# Patient Record
Sex: Female | Born: 1990 | Race: Black or African American | Hispanic: No | Marital: Married | Smoking: Never smoker
Health system: Southern US, Community
[De-identification: ages and names within clinical notes are randomized; demographics above are authoritative.]

## PROBLEM LIST (undated history)

## (undated) DIAGNOSIS — M545 Low back pain, unspecified: Secondary | ICD-10-CM

## (undated) DIAGNOSIS — G8929 Other chronic pain: Secondary | ICD-10-CM

## (undated) DIAGNOSIS — G43909 Migraine, unspecified, not intractable, without status migrainosus: Secondary | ICD-10-CM

## (undated) HISTORY — DX: Low back pain: M54.5

## (undated) HISTORY — DX: Migraine, unspecified, not intractable, without status migrainosus: G43.909

## (undated) HISTORY — DX: Other chronic pain: G89.29

## (undated) HISTORY — DX: Low back pain, unspecified: M54.50

---

## 2016-07-20 ENCOUNTER — Encounter: Payer: Self-pay | Admitting: *Deleted

## 2016-07-21 ENCOUNTER — Encounter: Payer: Self-pay | Admitting: Neurology

## 2016-07-21 ENCOUNTER — Ambulatory Visit (INDEPENDENT_AMBULATORY_CARE_PROVIDER_SITE_OTHER): Payer: BLUE CROSS/BLUE SHIELD | Admitting: Neurology

## 2016-07-21 ENCOUNTER — Encounter (INDEPENDENT_AMBULATORY_CARE_PROVIDER_SITE_OTHER): Payer: Self-pay

## 2016-07-21 DIAGNOSIS — IMO0002 Reserved for concepts with insufficient information to code with codable children: Secondary | ICD-10-CM | POA: Insufficient documentation

## 2016-07-21 DIAGNOSIS — G43709 Chronic migraine without aura, not intractable, without status migrainosus: Secondary | ICD-10-CM

## 2016-07-21 MED ORDER — RIBOFLAVIN 100 MG PO CAPS: 100.0000 mg | ORAL_CAPSULE | Freq: Two times a day (BID) | ORAL | 11 refills | Status: DC

## 2016-07-21 MED ORDER — MAGNESIUM OXIDE -MG SUPPLEMENT 400 (240 MG) MG PO TABS: 400.0000 mg | ORAL_TABLET | Freq: Two times a day (BID) | ORAL | 11 refills | Status: DC

## 2016-07-21 MED ORDER — SUMATRIPTAN SUCCINATE 50 MG PO TABS: 50.0000 mg | ORAL_TABLET | ORAL | 6 refills | Status: DC | PRN

## 2016-07-21 NOTE — Progress Notes (Signed)
PATIENT: Allison Tran DOB: [birth year]  Chief Complaint  Patient presents with  . New Patient (Initial Visit)    Rm 4. Patient reports that she has suffered from migraines for many years. Reports that she gets migraines 2-3 times a week.      HISTORICAL  Allison Tran is a 26 years old right-handed female, accompanied by her husband seen in refer by her primary care nurse practitioner Dolan Amen for evaluation of chronic migraine, she is a native of [place], speaks [place], the history through interpretation of her husband. Initial evaluation is on July 21 2016  She reported a history of headache since 2014, lateralized holo-cranial severe pressure headache with no nausea, she denies significant light or noise sensitivity.  She is now having headaches 2-3 times each week, each episode last couple hours, she has tried naproxen 500 mg as needed with limited help,  In between episodes she denied visual loss no lateralized motor or sensory deficit.  Her son is 31 months old, she still nursing, did not notice any significant change of her migraine headache during pregnancy  REVIEW OF SYSTEMS: Full 14 system review of systems performed and notable only for headache  ALLERGIES: No Known Allergies  HOME MEDICATIONS: Current Outpatient Prescriptions  Medication Sig Dispense Refill  . Ferrous Sulfate (IRON) 28 MG TABS Take by mouth.    . naproxen (NAPROSYN) 500 MG tablet Take by mouth.    . ranitidine (ZANTAC) 150 MG tablet Take by mouth.    . Vitamin D, Ergocalciferol, (DRISDOL) 50000 units CAPS capsule Take by mouth.     No current facility-administered medications for this visit.     PAST MEDICAL HISTORY: Past Medical History:  Diagnosis Date  . Chronic low back pain   . Migraine     PAST SURGICAL HISTORY: No past surgical history on file.  FAMILY HISTORY: Family History  Problem Relation Age of Onset  . Ulcers Mother   . Ulcers Father     SOCIAL  HISTORY:  Social History   Social History  . Marital status: Married    Spouse name: N/A  . Number of children: 1  . Years of education: N/A   Occupational History  . Since Nov 2017.   Social History Main Topics  . Smoking status: Never Smoker  . Smokeless tobacco: Never Used  . Alcohol use No  . Drug use: No  . Sexual activity: Not on file   Other Topics Concern  . Not on file   Social History Narrative   Lives at home with husband.   Denies caffeine use      PHYSICAL EXAM   Vitals:   07/21/16 0723  BP: 97/63  Pulse: 74  Weight: 174 lb (78.9 kg)  Height: 5\' 5"  (1.651 m)    Not recorded      Body mass index is 28.96 kg/m.  PHYSICAL EXAMNIATION:  Gen: NAD, conversant, well nourised, obese, well groomed                     Cardiovascular: Regular rate rhythm, no peripheral edema, warm, nontender. Eyes: Conjunctivae clear without exudates or hemorrhage Neck: Supple, no carotid bruits. Pulmonary: Clear to auscultation bilaterally   NEUROLOGICAL EXAM:  MENTAL STATUS: Speech:    Speech is normal; fluent and spontaneous with normal comprehension.  Cognition:     Orientation to time, place and person     Normal recent and remote memory     Normal Attention  span and concentration     Normal Language, naming, repeating,spontaneous speech     Fund of knowledge   CRANIAL NERVES: CN II: Visual fields are full to confrontation. Fundoscopic exam is normal with sharp discs and no vascular changes. Pupils are round equal and briskly reactive to light. CN III, IV, VI: extraocular movement are normal. No ptosis. CN V: Facial sensation is intact to pinprick in all 3 divisions bilaterally. Corneal responses are intact.  CN VII: Face is symmetric with normal eye closure and smile. CN VIII: Hearing is normal to rubbing fingers CN IX, X: Palate elevates symmetrically. Phonation is normal. CN XI: Head turning and shoulder shrug are intact CN XII: Tongue is midline with  normal movements and no atrophy.  MOTOR: There is no pronator drift of out-stretched arms. Muscle bulk and tone are normal. Muscle strength is normal.  REFLEXES: Reflexes are 2+ and symmetric at the biceps, triceps, knees, and ankles. Plantar responses are flexor.  SENSORY: Intact to light touch, pinprick, positional sensation and vibratory sensation are intact in fingers and toes.  COORDINATION: Rapid alternating movements and fine finger movements are intact. There is no dysmetria on finger-to-nose and heel-knee-shin.    GAIT/STANCE: Posture is normal. Gait is steady with normal steps, base, arm swing, and turning. Heel and toe walking are normal. Tandem gait is normal.  Romberg is absent.   DIAGNOSTIC DATA (LABS, IMAGING, TESTING) - I reviewed patient records, labs, notes, testing and imaging myself where available.   ASSESSMENT AND PLAN  Allison Tran is a 26 y.o. female   Chronic migraine  She is still nursing  Imitrex 50 mg as needed  Magnesium oxide 400 mg, riboflavin 100 mg twice a day as preventive medications  Document frequency of migraine, return to clinic in 3 months   Levert FeinsteinYijun Grant Swager, M.D. Ph.D.  [facility] [address], [address] [place], [place] [address] Ph: [phone] Fax: [phone]  CC: Dolan AmenSarah M Bailey, FNP

## 2016-10-19 ENCOUNTER — Telehealth: Payer: Self-pay | Admitting: Neurology

## 2016-10-19 NOTE — Telephone Encounter (Signed)
Pt's husband cannot get off work to bring her to an appt. The latest he could get her here is 4:10. He has to be at work by 7 and gets off at 3:30. Is there anyway to accommodate his schedule?

## 2016-10-19 NOTE — Telephone Encounter (Signed)
Spoke to patient's husband - they will come in on 11/24/16 for a 4pm appt.

## 2016-10-19 NOTE — Telephone Encounter (Signed)
Spoke to husband.  He had heard VM message from [place].  They did not have anyone else (neighbor or church) that could bring her.  (He mentioned someone, but was not sure when this person would [place] from [place].  I relayed that could get a interpreter for her if someone could bring her.  4pm option?

## 2016-10-21 ENCOUNTER — Ambulatory Visit: Payer: BLUE CROSS/BLUE SHIELD | Admitting: Nurse Practitioner

## 2016-11-24 ENCOUNTER — Ambulatory Visit (INDEPENDENT_AMBULATORY_CARE_PROVIDER_SITE_OTHER): Payer: BLUE CROSS/BLUE SHIELD | Admitting: Neurology

## 2016-11-24 ENCOUNTER — Encounter: Payer: Self-pay | Admitting: Neurology

## 2016-11-24 VITALS — BP 102/65 | HR 81 | Ht 65.0 in | Wt 177.0 lb

## 2016-11-24 DIAGNOSIS — G43709 Chronic migraine without aura, not intractable, without status migrainosus: Secondary | ICD-10-CM | POA: Diagnosis not present

## 2016-11-24 DIAGNOSIS — IMO0002 Reserved for concepts with insufficient information to code with codable children: Secondary | ICD-10-CM

## 2016-11-24 MED ORDER — PROPRANOLOL HCL 40 MG PO TABS: 40.0000 mg | ORAL_TABLET | Freq: Two times a day (BID) | ORAL | 11 refills | Status: DC

## 2016-11-24 MED ORDER — SUMATRIPTAN SUCCINATE 100 MG PO TABS: 100.0000 mg | ORAL_TABLET | ORAL | 11 refills | Status: DC | PRN

## 2016-11-24 NOTE — Progress Notes (Signed)
PATIENT: Allison Tran DOB: 12/23/1990  Chief Complaint  Patient presents with  . Migraine    She is here with her husband, Allison Tran.  Says she is taking the magnesium and riboflavin supplements.  She has noticed an improvement in her migraines and estimates two headaches days weekly.  Sumatriptan has been helpful.  She is still nursing her 7317-month old son.     HISTORICAL  Allison Tran is a 26 years old right-handed female, accompanied by her husband seen in refer by her primary care nurse practitioner Dolan AmenSarah M Bailey for evaluation of chronic migraine, she is a native of [place], speaks [place], the history through interpretation of her husband. Initial evaluation is on July 21 2016  She reported a history of headache since 2014, lateralized holo-cranial severe pressure headache with no nausea, she denies significant light or noise sensitivity.  She is now having headaches 2-3 times each week, each episode last couple hours, she has tried naproxen 500 mg as needed with limited help,  In between episodes she denied visual loss no lateralized motor or sensory deficit.  Her son is 3410 months old, she still nursing, did not notice any significant change of her migraine headache during pregnancy  Update November 24 2016: Her headache showed mild improvement, instead of 3 times each week, she is now having headache twice each week, lasting for a few hours,  She is taking magnesium oxide, riboflavin twice a day, Maxalt 50 mg has helped her most of the time but not always,   REVIEW OF SYSTEMS: Full 14 system review of systems performed and notable only for headache  ALLERGIES: No Known Allergies  HOME MEDICATIONS: Current Outpatient Prescriptions  Medication Sig Dispense Refill  . Ferrous Sulfate (IRON) 28 MG TABS Take by mouth.    . Magnesium Oxide 400 (240 Mg) MG TABS Take 1 tablet (400 mg total) by mouth 2 (two) times daily. 60 tablet 11  . naproxen (NAPROSYN) 500 MG tablet  Take by mouth.    . ranitidine (ZANTAC) 150 MG tablet Take by mouth.    . Riboflavin 100 MG CAPS Take 1 capsule (100 mg total) by mouth 2 (two) times daily. 60 capsule 11  . SUMAtriptan (IMITREX) 50 MG tablet Take 1 tablet (50 mg total) by mouth every 2 (two) hours as needed for migraine. May repeat in 2 hours if headache persists or recurs. 15 tablet 6   No current facility-administered medications for this visit.     PAST MEDICAL HISTORY: Past Medical History:  Diagnosis Date  . Chronic low back pain   . Migraine     PAST SURGICAL HISTORY: No past surgical history on file.  FAMILY HISTORY: Family History  Problem Relation Age of Onset  . Ulcers Mother   . Ulcers Father     SOCIAL HISTORY:  Social History   Social History  . Marital status: Married    Spouse name: N/A  . Number of children: 1  . Years of education: N/A   Occupational History  . Since Nov 2017.   Social History Main Topics  . Smoking status: Never Smoker  . Smokeless tobacco: Never Used  . Alcohol use No  . Drug use: No  . Sexual activity: Not on file   Other Topics Concern  . Not on file   Social History Narrative   Lives at home with husband.   Denies caffeine use      PHYSICAL EXAM   Vitals:   11/24/16 1632  BP: 102/65  Pulse: 81  Weight: 177 lb (80.3 kg)  Height: 5\' 5"  (1.651 m)    Not recorded      Body mass index is 29.45 kg/m.  PHYSICAL EXAMNIATION:  Gen: NAD, conversant, well nourised, obese, well groomed                     Cardiovascular: Regular rate rhythm, no peripheral edema, warm, nontender. Eyes: Conjunctivae clear without exudates or hemorrhage Neck: Supple, no carotid bruits. Pulmonary: Clear to auscultation bilaterally   NEUROLOGICAL EXAM:  MENTAL STATUS: Speech:    Speech is normal; fluent and spontaneous with normal comprehension.  Cognition:     Orientation to time, place and person     Normal recent and remote memory     Normal Attention span  and concentration     Normal Language, naming, repeating,spontaneous speech     Fund of knowledge   CRANIAL NERVES: CN II: Visual fields are full to confrontation. Fundoscopic exam is normal with sharp discs and no vascular changes. Pupils are round equal and briskly reactive to light. CN III, IV, VI: extraocular movement are normal. No ptosis. CN V: Facial sensation is intact to pinprick in all 3 divisions bilaterally. Corneal responses are intact.  CN VII: Face is symmetric with normal eye closure and smile. CN VIII: Hearing is normal to rubbing fingers CN IX, X: Palate elevates symmetrically. Phonation is normal. CN XI: Head turning and shoulder shrug are intact CN XII: Tongue is midline with normal movements and no atrophy.  MOTOR: There is no pronator drift of out-stretched arms. Muscle bulk and tone are normal. Muscle strength is normal.  REFLEXES: Reflexes are 2+ and symmetric at the biceps, triceps, knees, and ankles. Plantar responses are flexor.  SENSORY: Intact to light touch, pinprick, positional sensation and vibratory sensation are intact in fingers and toes.  COORDINATION: Rapid alternating movements and fine finger movements are intact. There is no dysmetria on finger-to-nose and heel-knee-shin.    GAIT/STANCE: Posture is normal. Gait is steady with normal steps, base, arm swing, and turning. Heel and toe walking are normal. Tandem gait is normal.  Romberg is absent.   DIAGNOSTIC DATA (LABS, IMAGING, TESTING) - I reviewed patient records, labs, notes, testing and imaging myself where available.   ASSESSMENT AND PLAN  Allison Tran is a [age] y.o. female   Chronic migraine  Continue Magnesium oxide 400 mg, riboflavin 100 mg twice a day as preventive medications  Add on propranolol 40 mg twice a day  Increased Imitrex 100 mg as needed   Levert FeinsteinYijun Jawara Latorre, M.D. Ph.D.  [facility] [address], [address] [place], [place] [address] Ph: [phone]  [phone] Fax: [phone]  CC: Dolan AmenSarah M Bailey, FNP

## 2016-11-24 NOTE — Patient Instructions (Signed)
Magnesium oxide 400 mg twice a day Riboflavin 100 mg twice a day=Vitamin B12

## 2019-11-16 ENCOUNTER — Emergency Department (HOSPITAL_COMMUNITY)
Admission: EM | Admit: 2019-11-16 | Discharge: 2019-11-16 | Disposition: A | Discharge status: Home / Self Care | Payer: BC Managed Care – PPO | Attending: Emergency Medicine | Admitting: Emergency Medicine

## 2019-11-16 ENCOUNTER — Other Ambulatory Visit: Payer: Self-pay

## 2019-11-16 ENCOUNTER — Emergency Department (HOSPITAL_COMMUNITY): Payer: BC Managed Care – PPO

## 2019-11-16 ENCOUNTER — Encounter (HOSPITAL_COMMUNITY): Payer: Self-pay

## 2019-11-16 DIAGNOSIS — R070 Pain in throat: Secondary | ICD-10-CM | POA: Diagnosis not present

## 2019-11-16 DIAGNOSIS — R0789 Other chest pain: Secondary | ICD-10-CM | POA: Insufficient documentation

## 2019-11-16 LAB — CBC
HCT: 33.3 % — ABNORMAL LOW (ref 36.0–46.0)
Hemoglobin: 11.2 g/dL — ABNORMAL LOW (ref 12.0–15.0)
MCH: 32.7 pg (ref 26.0–34.0)
MCHC: 33.6 g/dL (ref 30.0–36.0)
MCV: 97.4 fL (ref 80.0–100.0)
Platelets: 236 10*3/uL (ref 150–400)
RBC: 3.42 MIL/uL — ABNORMAL LOW (ref 3.87–5.11)
RDW: 13.3 % (ref 11.5–15.5)
WBC: 5.2 10*3/uL (ref 4.0–10.5)
nRBC: 0 % (ref 0.0–0.2)

## 2019-11-16 LAB — I-STAT BETA HCG BLOOD, ED (NOT ORDERABLE): I-stat hCG, quantitative: <5 m[IU]/mL (ref <5)

## 2019-11-16 LAB — BASIC METABOLIC PANEL
Anion gap: 10 (ref 5–15)
BUN: 10 mg/dL (ref 6–20)
CO2: 23 mmol/L (ref 22–32)
Calcium: 9 mg/dL (ref 8.9–10.3)
Chloride: 104 mmol/L (ref 98–111)
Creatinine, Ser: 0.55 mg/dL (ref 0.44–1.00)
GFR calc Af Amer: >60 mL/min (ref >60)
GFR calc non Af Amer: >60 mL/min (ref >60)
Glucose, Bld: 115 mg/dL — ABNORMAL HIGH (ref 70–99)
Potassium: 3.4 mmol/L — ABNORMAL LOW (ref 3.5–5.1)
Sodium: 137 mmol/L (ref 135–145)

## 2019-11-16 LAB — GROUP A STREP BY PCR: Group A Strep by PCR: NOT DETECTED

## 2019-11-16 LAB — TROPONIN I (HIGH SENSITIVITY): Troponin I (High Sensitivity): <2 ng/L (ref <18)

## 2019-11-16 MED ORDER — OMEPRAZOLE 20 MG PO CPDR
20.0000 mg | DELAYED_RELEASE_CAPSULE | Freq: Every day | ORAL | 0 refills | Status: DC
Start: 2019-11-16 — End: 2023-08-29

## 2019-11-16 MED ORDER — SODIUM CHLORIDE 0.9% FLUSH: 3.0000 mL | Freq: Once | INTRAVENOUS | Status: DC

## 2019-11-16 NOTE — ED Triage Notes (Signed)
Pt reports intermittent chest pain and chills for 5 months. Reports that she came in tonight because she felt like "her breathing was going to stop." Denies any medical hx. Reports that she takes no medication daily. Speaks only [place].

## 2019-11-16 NOTE — ED Provider Notes (Signed)
[facility]-EMERGENCY DEPT Provider Note   CSN: [id number] Arrival date & time: 11/16/19  0242     History Chief Complaint  Patient presents with  . Chest Pain    Allison Tran is a 29 y.o. female.  HPI Patient speaks [place] and interpreter was used to collect HPI.   Patient presents to the emergency department chief complaint of throat pain that has been going on for the last 5 months.  Patient explains the pain is intermittent, it comes and goes and describes the pain as a scratchy pain.  She admits that it is worse after she eats but denies any sort of alleviating factors.  She also admits that she has intermittent right-sided sternal pain, does not radiate, denies nausea, vomiting, becoming diaphoretic, paresthesias.  She denies fever, chills, nasal congestion, earache, recent sick contacts, malaise.  She denies taking any medication and has not seen a primary care doctor for this issue.  She has no similar medical history, does not take any medication daily basis.  She denies headache, fever, chills, shortness of breath, chest pain, nausea, vomiting, diarrhea, dysuria, pedal edema.  History reviewed. No pertinent past medical history.  There are no problems to display for this patient.      OB History   No obstetric history on file.     History reviewed. No pertinent family history.  Social History   Tobacco Use  . Smoking status: Not on file  Substance Use Topics  . Alcohol use: Not on file  . Drug use: Not on file    Home Medications Prior to Admission medications   Medication Sig Start Date End Date Taking? Authorizing Provider  omeprazole (PRILOSEC) 20 MG capsule Take 1 capsule (20 mg total) by mouth daily. 11/16/19   Carroll Sage, PA-C    Allergies    Patient has no known allergies.  Review of Systems   Review of Systems  Constitutional: Negative for chills and fever.  HENT: Positive for trouble swallowing. Negative  for congestion, ear discharge, postnasal drip, sinus pressure, sneezing and voice change.   Eyes: Negative for visual disturbance.  Respiratory: Negative for cough and shortness of breath.   Cardiovascular: Negative for chest pain, palpitations and leg swelling.  Gastrointestinal: Negative for abdominal pain, diarrhea, nausea and vomiting.  Genitourinary: Negative for enuresis, flank pain and vaginal bleeding.  Musculoskeletal: Negative for back pain and myalgias.  Skin: Negative for rash.  Neurological: Negative for dizziness and headaches.  Hematological: Does not bruise/bleed easily.    Physical Exam Updated Vital Signs BP 107/70   Pulse 63   Temp 98.9 F (37.2 C) (Oral)   Resp 15   SpO2 100%   Physical Exam Vitals and nursing note reviewed.  Constitutional:      General: She is not in acute distress.    Appearance: She is not ill-appearing.  HENT:     Head: Normocephalic and atraumatic.     Right Ear: Tympanic membrane, ear canal and external ear normal. There is no impacted cerumen.     Left Ear: Tympanic membrane, ear canal and external ear normal. There is no impacted cerumen.     Nose: No congestion.     Mouth/Throat:     Mouth: Mucous membranes are moist.     Pharynx: Oropharynx is clear. No oropharyngeal exudate or posterior oropharyngeal erythema.     Comments: Patient's heart was visualized, uvula was midline, tongue was midline, no exudates or erythema noted.  Patient was  controlling her own secretions. Eyes:     General: No scleral icterus.    Pupils: Pupils are equal, round, and reactive to light.  Cardiovascular:     Rate and Rhythm: Normal rate and regular rhythm.     Pulses: Normal pulses.     Heart sounds: No murmur heard.  No friction rub. No gallop.   Pulmonary:     Effort: No respiratory distress.     Breath sounds: No wheezing, rhonchi or rales.  Abdominal:     General: There is no distension.     Tenderness: There is no abdominal tenderness.  There is no right CVA tenderness, left CVA tenderness or guarding.     Comments: Patient's abdomen was nondistended, normal active bowel sounds, dull to percussion, patient had slight epigastric pain from palpation there is no rebound tenderness, no acute abdomen noted.  Musculoskeletal:        General: No swelling or signs of injury.     Cervical back: Neck supple. No rigidity or tenderness.     Right lower leg: No edema.     Left lower leg: No edema.  Skin:    General: Skin is warm and dry.     Capillary Refill: Capillary refill takes less than 2 seconds.     Findings: No rash.  Neurological:     Mental Status: She is alert and oriented to person, place, and time.  Psychiatric:        Mood and Affect: Mood normal.     ED Results / Procedures / Treatments   Labs (all labs ordered are listed, but only abnormal results are displayed) Labs Reviewed  BASIC METABOLIC PANEL - Abnormal; Notable for the following components:      Result Value   Potassium 3.4 (*)    Glucose, Bld 115 (*)    All other components within normal limits  CBC - Abnormal; Notable for the following components:   RBC 3.42 (*)    Hemoglobin 11.2 (*)    HCT 33.3 (*)    All other components within normal limits  GROUP A STREP BY PCR  I-STAT BETA HCG BLOOD, ED ([facility], [facility], [facility] ONLY)  I-STAT BETA HCG BLOOD, ED (NOT ORDERABLE)  TROPONIN I (HIGH SENSITIVITY)    EKG EKG Interpretation  Date/Time:  Friday November 16 2019 03:14:02 EDT Ventricular Rate:  80 PR Interval:    QRS Duration: 85 QT Interval:  385 QTC Calculation: 445 R Axis:   55 Text Interpretation: Sinus rhythm 12 Lead; Mason-Likar No old tracing to compare Confirmed by Rochele Raring [phone]) on 11/16/2019 3:21:46 AM   Radiology DG Chest 2 View  Result Date: 11/16/2019 CLINICAL DATA:  29 year old female with chest pain. EXAM: CHEST - 2 VIEW COMPARISON:  None. FINDINGS: The heart size and mediastinal contours are within normal limits. Both lungs are clear.  The visualized skeletal structures are unremarkable. IMPRESSION: No active cardiopulmonary disease. Electronically Signed   By: Elgie Collard M.D.   On: 11/16/2019 03:33    Procedures Procedures (including critical care time)  Medications Ordered in ED Medications  sodium chloride flush (NS) 0.9 % injection 3 mL (has no administration in time range)    ED Course  I have reviewed the triage vital signs and the nursing notes.  Pertinent labs & imaging results that were available during my care of the patient were reviewed by me and considered in my medical decision making (see chart for details).    MDM Rules/Calculators/A&P  I have personally reviewed all imaging, labs and have interpreted them.  Due to patient's complaint most concern for cardiac abnormality versus PE versus electrolyte abnormality versus strep throat.  Unlikely patient suffering from a cardiac abnormality as patient explains she has been having episodic chest pain for last 5 months describes it being on the right substernal, physical exam was benign, had slight epigastric pain upon palpation, troponin was less than 2, EKG showed sinus rhythm without signs of ischemia she denies having chest pain or shortness of breath for the last 10 hours.  Unlikely patient suffering from a metabolic abnormality as BMP does not show electrolyte abnormalities, no signs of AKI.  Unlikely patient suffering from systemic infection as patient's vitals are reassuring, patient is nontoxic-appearing, CBC does not show leukocytosis but does have slight anemia 11.2.  Patient's chest x-ray was benign did not show edema, infiltrates, consolidations, or widening mediastinum.  Unlikely patient suffering from a PE as she denies pleuritic chest pain, shortness of breath, denies pedal edema, leg pain, she does not smoke, has had no recent trauma or long immobilizations, not hormone therapy.  Strep throat came back negative, possible  patient's slight epigastric pain as well sore throat could be secondary to acid reflux.  Patient appears to be resting calmly in bed show no acute signs distress.  Vital signs remained stable does not meet criteria to be admitted to the hospital.  Likely patient's symptoms are result of GERD and will treat patient with PPIs and give recommendations for healthy food choices.  Patient was discussed with attending who agrees with assessment and plan.  Patient was given at home care as well as strict return precautions.  Patient verbalized that she understood and agreed to plan. Final Clinical Impression(s) / ED Diagnoses Final diagnoses:  Throat pain    Rx / DC Orders ED Discharge Orders         Ordered    omeprazole (PRILOSEC) 20 MG capsule  Daily     Discontinue  Reprint     11/16/19 1744           Carroll Sage, PA-C 11/16/19 1800    Tegeler, Canary Brim, MD 11/16/19 1806

## 2019-11-16 NOTE — Discharge Instructions (Addendum)
You have been seen here for a sore throat.  All lab work and imaging look reassuring.  I prescribed you an acid pill please take as prescribed.  I have given you information for healthy food choices for acid reflux. I recommend you stay away from carbonated drinks, spicy foods, acidic foods, caffeine as this can make acid reflux worse.  I want you to follow-up with your primary doctor in 2 to 3 weeks for reevaluation  I want to come back to emergency department if you cannot swallow your own saliva, change in voice, difficulty breathing, chest pain, uncontrolled nausea, vomiting, diarrhea fever as he symptoms require further evaluation management.

## 2019-11-19 ENCOUNTER — Encounter: Payer: Self-pay | Admitting: Neurology

## 2020-03-26 ENCOUNTER — Other Ambulatory Visit: Payer: BC Managed Care – PPO

## 2020-03-26 ENCOUNTER — Other Ambulatory Visit: Payer: Self-pay

## 2020-03-26 DIAGNOSIS — Z20822 Contact with and (suspected) exposure to covid-19: Secondary | ICD-10-CM

## 2020-03-27 LAB — SARS-COV-2, NAA 2 DAY TAT

## 2020-03-27 LAB — NOVEL CORONAVIRUS, NAA: SARS-CoV-2, NAA: NOT DETECTED

## 2020-03-29 ENCOUNTER — Other Ambulatory Visit: Payer: Self-pay

## 2020-03-29 ENCOUNTER — Other Ambulatory Visit: Payer: BC Managed Care – PPO

## 2020-03-29 DIAGNOSIS — Z20822 Contact with and (suspected) exposure to covid-19: Secondary | ICD-10-CM

## 2020-03-30 LAB — NOVEL CORONAVIRUS, NAA: SARS-CoV-2, NAA: NOT DETECTED

## 2020-03-30 LAB — SARS-COV-2, NAA 2 DAY TAT

## 2020-12-28 ENCOUNTER — Emergency Department (HOSPITAL_COMMUNITY)
Admission: EM | Admit: 2020-12-28 | Discharge: 2020-12-28 | Disposition: A | Discharge status: Home / Self Care | Payer: BC Managed Care – PPO | Attending: Emergency Medicine | Admitting: Emergency Medicine

## 2020-12-28 ENCOUNTER — Encounter (HOSPITAL_COMMUNITY): Payer: Self-pay | Admitting: Emergency Medicine

## 2020-12-28 ENCOUNTER — Emergency Department (HOSPITAL_COMMUNITY): Payer: BC Managed Care – PPO

## 2020-12-28 ENCOUNTER — Other Ambulatory Visit: Payer: Self-pay

## 2020-12-28 DIAGNOSIS — O0091 Unspecified ectopic pregnancy with intrauterine pregnancy: Secondary | ICD-10-CM | POA: Insufficient documentation

## 2020-12-28 DIAGNOSIS — Z3A00 Weeks of gestation of pregnancy not specified: Secondary | ICD-10-CM | POA: Insufficient documentation

## 2020-12-28 DIAGNOSIS — O034 Incomplete spontaneous abortion without complication: Secondary | ICD-10-CM

## 2020-12-28 DIAGNOSIS — O26851 Spotting complicating pregnancy, first trimester: Secondary | ICD-10-CM | POA: Insufficient documentation

## 2020-12-28 DIAGNOSIS — N939 Abnormal uterine and vaginal bleeding, unspecified: Secondary | ICD-10-CM

## 2020-12-28 LAB — BASIC METABOLIC PANEL
Anion gap: 7 (ref 5–15)
BUN: 9 mg/dL (ref 6–20)
CO2: 24 mmol/L (ref 22–32)
Calcium: 9.3 mg/dL (ref 8.9–10.3)
Chloride: 108 mmol/L (ref 98–111)
Creatinine, Ser: 0.48 mg/dL (ref 0.44–1.00)
GFR, Estimated: >60 mL/min (ref >60)
Glucose, Bld: 96 mg/dL (ref 70–99)
Potassium: 4.1 mmol/L (ref 3.5–5.1)
Sodium: 139 mmol/L (ref 135–145)

## 2020-12-28 LAB — URINALYSIS, ROUTINE W REFLEX MICROSCOPIC
Bacteria, UA: NONE SEEN
Bilirubin Urine: NEGATIVE
Glucose, UA: NEGATIVE mg/dL
Ketones, ur: NEGATIVE mg/dL
Leukocytes,Ua: NEGATIVE
Nitrite: NEGATIVE
Protein, ur: 30 mg/dL — AB
RBC / HPF: >50 RBC/hpf — ABNORMAL HIGH (ref 0–5)
Specific Gravity, Urine: 1.005 (ref 1.005–1.030)
pH: 7 (ref 5.0–8.0)

## 2020-12-28 LAB — CBC
HCT: 33.4 % — ABNORMAL LOW (ref 36.0–46.0)
Hemoglobin: 11.3 g/dL — ABNORMAL LOW (ref 12.0–15.0)
MCH: 32.1 pg (ref 26.0–34.0)
MCHC: 33.8 g/dL (ref 30.0–36.0)
MCV: 94.9 fL (ref 80.0–100.0)
Platelets: 243 10*3/uL (ref 150–400)
RBC: 3.52 MIL/uL — ABNORMAL LOW (ref 3.87–5.11)
RDW: 13.9 % (ref 11.5–15.5)
WBC: 4.6 10*3/uL (ref 4.0–10.5)
nRBC: 0 % (ref 0.0–0.2)

## 2020-12-28 LAB — ABO/RH: ABO/RH(D): O POS

## 2020-12-28 LAB — HCG, QUANTITATIVE, PREGNANCY: hCG, Beta Chain, Quant, S: 964 m[IU]/mL — ABNORMAL HIGH (ref <5)

## 2020-12-28 MED ORDER — ACETAMINOPHEN 325 MG PO TABS
650.0000 mg | ORAL_TABLET | Freq: Once | ORAL | Status: AC
  Administered 2020-12-28: 650 mg via ORAL
  Filled 2020-12-28: qty 2

## 2020-12-28 NOTE — ED Provider Notes (Signed)
[facility]-EMERGENCY DEPT Provider Note   CSN: [id number] Arrival date & time: 12/28/20  0768     History Chief Complaint  Patient presents with   Vaginal Bleeding    Allison Tran is a 30 y.o. female who is G3, P2002 who presents emergency department for vaginal bleeding.  Patient is French-speaking but she and her husband prefer to translate through him although I offered professional translation services.  Patient has been having pink-tinged bleeding for about 2 weeks.  They contacted their OB/GYN who states that if it got any worse to seek immediate evaluation.  Yesterday bleeding increased and this morning she is having passage of blood and having to wear a panty liner without passage of clots or tissue.  She is also having significant cramping.  She is not taking anything for pain.  She denies urinary symptoms, constipation or diarrhea.  All of her previous pregnancies have been full-term and she has had no complications.   Vaginal Bleeding     Past Medical History:  Diagnosis Date   Chronic low back pain    Migraine     Patient Active Problem List   Diagnosis Date Noted   Chronic migraine 07/21/2016    History reviewed. No pertinent surgical history.   OB History     Gravida  1   Para      Term      Preterm      AB      Living         SAB      IAB      Ectopic      Multiple      Live Births              Family History  Problem Relation Age of Onset   Ulcers Mother    Ulcers Father     Social History   Tobacco Use   Smoking status: Never   Smokeless tobacco: Never  Substance Use Topics   Alcohol use: No   Drug use: No    Home Medications Prior to Admission medications   Medication Sig Start Date End Date Taking? Authorizing Provider  Ferrous Sulfate (IRON) 28 MG TABS Take by mouth.    [provider]  Magnesium Oxide 400 (240 Mg) MG TABS Take 1 tablet (400 mg total) by mouth 2 (two)  times daily. 07/21/16   Levert Feinstein, MD  naproxen (NAPROSYN) 500 MG tablet Take by mouth. 06/02/16   [provider]  omeprazole (PRILOSEC) 20 MG capsule Take 1 capsule (20 mg total) by mouth daily. 11/16/19   Carroll Sage, PA-C  propranolol (INDERAL) 40 MG tablet Take 1 tablet (40 mg total) by mouth 2 (two) times daily. 11/24/16   Levert Feinstein, MD  ranitidine (ZANTAC) 150 MG tablet Take by mouth. 06/02/16 06/02/17  [provider]  Riboflavin 100 MG CAPS Take 1 capsule (100 mg total) by mouth 2 (two) times daily. 07/21/16   Levert Feinstein, MD  SUMAtriptan (IMITREX) 100 MG tablet Take 1 tablet (100 mg total) by mouth every 2 (two) hours as needed for migraine. May repeat in 2 hours if headache persists or recurs. 11/24/16   Levert Feinstein, MD    Allergies    Patient has no known allergies.  Review of Systems   Review of Systems  Genitourinary:  Positive for vaginal bleeding.  Ten systems reviewed and are negative for acute change, except as noted in the HPI.  Physical Exam Updated Vital Signs BP 116/82 (BP Location: Right Arm)   Pulse 85   Temp 98.4 F (36.9 C) (Oral)   Resp (!) 22   Ht 5\' 5"  (1.651 m)   Wt 80.3 kg   SpO2 100%   Breastfeeding Yes   BMI 29.45 kg/m   Physical Exam Physical Exam  Nursing note and vitals reviewed. Constitutional: She is oriented to person, place, and time. She appears well-developed and well-nourished. No distress.  HENT:  Head: Normocephalic and atraumatic.  Eyes: Conjunctivae normal and EOM are normal. Pupils are equal, round, and reactive to light. No scleral icterus.  Neck: Normal range of motion.  Cardiovascular: Normal rate, regular rhythm and normal heart sounds.  Exam reveals no gallop and no friction rub.   No murmur heard. Pulmonary/Chest: Effort normal and breath sounds normal. No respiratory distress.  Abdominal: Soft. Bowel sounds are normal. She exhibits no distension and no mass. There is no tenderness. There is no  guarding.  exam: VULVA: normal appearing vulva with no masses, tenderness or lesions, VAGINA: normal appearing vagina with normal color and discharge, no lesions, CERVIX: Os is dilated, tissue presnet in the os with significant bleeding, I removed a large volume of tissue with ringed forceps, Cervix suctioned and visualized with only a slow trickle of blood. exam chaperoned by [id number] (specimen sent to pathology) Neurological: She is alert and oriented to person, place, and time.  Skin: Skin is warm and dry. She is not diaphoretic.   ED Results / Procedures / Treatments   Labs (all labs ordered are listed, but only abnormal results are displayed) Labs Reviewed - No data to display  EKG None  Radiology No results found.  Procedures Procedures   Medications Ordered in ED Medications - No data to display  ED Course  I have reviewed the triage vital signs and the nursing notes.  Pertinent labs & imaging results that were available during my care of the patient were reviewed by me and considered in my medical decision making (see chart for details).  Clinical Course as of 12/28/20 1533  Sun Dec 28, 2020  1003 HCG, Beta Chain, Quant, 08-13-1968(!): 964 [AH]    Clinical Course User Index [AH] [place], PA-C   MDM Rules/Calculators/A&P                           30 year old female here with vaginal bleeding in early pregnancy.The differential diagnosis for vaginal bleeding in pregnancy less than 20 weeks includes but is not limited to the following: Ectopic pregnancy, Subchorionic hematoma, First Trimester Abortion, Gestational trophoblastic disease, Heterotopic pregnancy, Implantation bleeding, Molar pregnancy, Cervicitis, Fibroids, Vaginal Trauma I ordered and reviewed labs that included CBC with mild normocytic anemia, urinalysis without evidence of infection.  BMP without abnormality.  Patient's hCG of 964 is far below the expected range of 15,000-200,000.  On  physical examination I removed a large volume of tissue that appeared to likely be products of conception.  I have sent this for pathology.  Patient ultrasound shows no evidence of fetus in the uterus and no obvious retained products of conception is seen her bleeding has slowed significantly.  I discussed this with the patient and her husband is aware that this is likely representative of miscarriage.  Patient will need follow-up hCG and I discussed the case with the midwife on-call in the MAU today who has placed consult with the office staff at [facility]  for women to have the patient follow-up in 1 week for repeat hCG and in 2 weeks to see a provider.  The patient appears otherwise medically clear and is hemodynamically stable.  Final Clinical Impression(s) / ED Diagnoses Final diagnoses:  None    Rx / DC Orders ED Discharge Orders     None        Arthor Captain, PA-C 12/28/20 1537    Virgina Norfolk, DO 12/28/20 2306

## 2020-12-28 NOTE — Discharge Instructions (Addendum)
Contact a health care provider if: You have a fever or chills. There is bad-smelling fluid coming from the vagina. You have more bleeding instead of less. Tissue or blood clots come out of your vagina. Get help right away if: You have severe cramps or pain in your back or abdomen. Heavy bleeding soaks through 2 large sanitary pads an hour for more than 2 hours. You become light-headed or weak. You faint. You feel sad, and your sadness takes over your thoughts. You think about hurting yourself. 

## 2020-12-28 NOTE — ED Triage Notes (Signed)
Patient presents for vaginal bleeding x2 weeks that increased yesterday. Patient was evaluated for the bleeding at [facility] per the husband, and were told if the bleeding increased to go to the ER. Patient endorses abd pain and nausea that began yesterday.

## 2020-12-29 ENCOUNTER — Telehealth: Payer: Self-pay

## 2020-12-30 LAB — SURGICAL PATHOLOGY

## 2021-01-06 ENCOUNTER — Other Ambulatory Visit: Payer: BC Managed Care – PPO

## 2021-01-07 ENCOUNTER — Other Ambulatory Visit: Payer: BC Managed Care – PPO

## 2021-01-07 ENCOUNTER — Other Ambulatory Visit: Payer: Self-pay | Admitting: General Practice

## 2021-01-07 ENCOUNTER — Other Ambulatory Visit: Payer: Self-pay

## 2021-01-07 DIAGNOSIS — O039 Complete or unspecified spontaneous abortion without complication: Secondary | ICD-10-CM

## 2021-01-08 LAB — BETA HCG QUANT (REF LAB): hCG Quant: 10 m[IU]/mL

## 2021-01-13 ENCOUNTER — Other Ambulatory Visit (HOSPITAL_COMMUNITY)
Admission: RE | Admit: 2021-01-13 | Discharge: 2021-01-13 | Disposition: A | Discharge status: Home / Self Care | Payer: BC Managed Care – PPO | Source: Ambulatory Visit

## 2021-01-13 ENCOUNTER — Ambulatory Visit (INDEPENDENT_AMBULATORY_CARE_PROVIDER_SITE_OTHER): Payer: BC Managed Care – PPO

## 2021-01-13 ENCOUNTER — Other Ambulatory Visit: Payer: Self-pay

## 2021-01-13 VITALS — BP 110/74 | HR 82 | Wt 178.8 lb

## 2021-01-13 DIAGNOSIS — R350 Frequency of micturition: Secondary | ICD-10-CM | POA: Diagnosis not present

## 2021-01-13 DIAGNOSIS — O039 Complete or unspecified spontaneous abortion without complication: Secondary | ICD-10-CM | POA: Diagnosis not present

## 2021-01-13 DIAGNOSIS — Z113 Encounter for screening for infections with a predominantly sexual mode of transmission: Secondary | ICD-10-CM | POA: Diagnosis not present

## 2021-01-13 LAB — POCT URINALYSIS DIP (DEVICE)
Bilirubin Urine: NEGATIVE
Glucose, UA: NEGATIVE mg/dL
Hgb urine dipstick: NEGATIVE
Ketones, ur: NEGATIVE mg/dL
Nitrite: NEGATIVE
Protein, ur: NEGATIVE mg/dL
Specific Gravity, Urine: 1.015 (ref 1.005–1.030)
Urobilinogen, UA: 0.2 mg/dL (ref 0.0–1.0)
pH: 5.5 (ref 5.0–8.0)

## 2021-01-13 NOTE — Progress Notes (Signed)
   GYNECOLOGY PROGRESS NOTE  History:  30 y.o. G1P0 presents to [facility] office today for follow up after SAB. This was a desired pregnancy. She reports that she feels as if she is overall healing well, both physically and mentally. She does desire to be pregnant, but thinks she is going to wait at least 3 months prior to trying again. She reports intermittent lower abdominal pain, however this has been ongoing since prior to finding out she was pregnant. She endorses some pain after voiding, but otherwise has no other symptoms. She has been taking Tylenol and Ibuprofen which help her pain. She denies vaginal bleeding or discharge. No h/a, dizziness, shortness of breath, n/v, or fever/chills.    The following portions of the patient's history were reviewed and updated as appropriate: allergies, current medications, past family history, past medical history, past social history, past surgical history and problem list. Last pap smear on 08/25/20 at [facility] was normal, negative HRHPV.  Health Maintenance Due  Topic Date Due   COVID-19 Vaccine (1) Never done   HIV Screening  Never done   Hepatitis C Screening  Never done   PAP SMEAR-Modifier  Never done   INFLUENZA VACCINE  12/01/2020     Review of Systems:  Pertinent items are noted in HPI.   Objective:  Physical Exam Blood pressure 110/74, pulse 82, weight 178 lb 12.8 oz (81.1 kg), not currently breastfeeding. VS reviewed, nursing note reviewed,  Constitutional: well developed, well nourished, no distress HEENT: normocephalic CV: normal rate Pulm/chest wall: normal effort Breast Exam: deferred Abdomen: soft Neuro: alert and oriented x 3 Skin: warm, dry Psych: affect normal Pelvic exam: deferred; self-swab obtained  Assessment & Plan:  1. Urinary frequency - Urine dip negative  2. Screening for STD (sexually transmitted disease)  - Cervicovaginal ancillary only( [place])  3. Spontaneous miscarriage - Declines contraception,  desires pregnancy in next 3 months - Continue taking prenatal vitamins/folic acid - Offered behavioral services, pt declines - Safe to resume sexual activity as desired - Follow up as needed     Brand Males, CNM 01/13/21 1:38 PM

## 2021-01-14 LAB — CERVICOVAGINAL ANCILLARY ONLY
Bacterial Vaginitis (gardnerella): POSITIVE — AB
Candida Glabrata: NEGATIVE
Candida Vaginitis: NEGATIVE
Chlamydia: NEGATIVE
Comment: NEGATIVE
Comment: NEGATIVE
Comment: NEGATIVE
Comment: NEGATIVE
Comment: NEGATIVE
Comment: NORMAL
Neisseria Gonorrhea: NEGATIVE
Trichomonas: NEGATIVE

## 2021-01-20 ENCOUNTER — Other Ambulatory Visit: Payer: Self-pay

## 2021-01-20 DIAGNOSIS — N760 Acute vaginitis: Secondary | ICD-10-CM

## 2021-01-20 DIAGNOSIS — B9689 Other specified bacterial agents as the cause of diseases classified elsewhere: Secondary | ICD-10-CM

## 2021-01-20 MED ORDER — METRONIDAZOLE 500 MG PO TABS: 500.0000 mg | ORAL_TABLET | Freq: Two times a day (BID) | ORAL | 0 refills | Status: DC

## 2021-01-20 NOTE — Telephone Encounter (Signed)
Called Pt using [employer] [place] id# [phone], to go over test results regarding testing + for BV, no answer, left VM for call back.

## 2021-01-20 NOTE — Telephone Encounter (Signed)
-----   Message from Brand Males, CNM sent at 01/20/2021  8:13 AM EDT ----- Patient has BV. Can you send in metronidazole 500mg  BID x7 days  Thanks! 

## 2021-04-11 ENCOUNTER — Emergency Department (HOSPITAL_COMMUNITY): Payer: BC Managed Care – PPO

## 2021-04-11 ENCOUNTER — Encounter (HOSPITAL_COMMUNITY): Payer: Self-pay | Admitting: Emergency Medicine

## 2021-04-11 ENCOUNTER — Other Ambulatory Visit: Payer: Self-pay

## 2021-04-11 ENCOUNTER — Emergency Department (HOSPITAL_COMMUNITY)
Admission: EM | Admit: 2021-04-11 | Discharge: 2021-04-11 | Disposition: A | Discharge status: Home / Self Care | Payer: BC Managed Care – PPO | Attending: Emergency Medicine | Admitting: Emergency Medicine

## 2021-04-11 DIAGNOSIS — N390 Urinary tract infection, site not specified: Secondary | ICD-10-CM

## 2021-04-11 DIAGNOSIS — R109 Unspecified abdominal pain: Secondary | ICD-10-CM | POA: Diagnosis present

## 2021-04-11 DIAGNOSIS — Z3A01 Less than 8 weeks gestation of pregnancy: Secondary | ICD-10-CM

## 2021-04-11 DIAGNOSIS — R1030 Lower abdominal pain, unspecified: Secondary | ICD-10-CM

## 2021-04-11 DIAGNOSIS — R1032 Left lower quadrant pain: Secondary | ICD-10-CM | POA: Insufficient documentation

## 2021-04-11 LAB — URINALYSIS, ROUTINE W REFLEX MICROSCOPIC
Bilirubin Urine: NEGATIVE
Glucose, UA: NEGATIVE mg/dL
Ketones, ur: NEGATIVE mg/dL
Nitrite: NEGATIVE
Protein, ur: NEGATIVE mg/dL
Specific Gravity, Urine: 1.024 (ref 1.005–1.030)
WBC, UA: >50 WBC/hpf — ABNORMAL HIGH (ref 0–5)
pH: 6 (ref 5.0–8.0)

## 2021-04-11 LAB — CBC WITH DIFFERENTIAL/PLATELET
Abs Immature Granulocytes: 0.01 10*3/uL (ref 0.00–0.07)
Basophils Absolute: 0 10*3/uL (ref 0.0–0.1)
Basophils Relative: 1 %
Eosinophils Absolute: 0.1 10*3/uL (ref 0.0–0.5)
Eosinophils Relative: 2 %
HCT: 35.1 % — ABNORMAL LOW (ref 36.0–46.0)
Hemoglobin: 11.6 g/dL — ABNORMAL LOW (ref 12.0–15.0)
Immature Granulocytes: 0 %
Lymphocytes Relative: 35 %
Lymphs Abs: 1.4 10*3/uL (ref 0.7–4.0)
MCH: 31.4 pg (ref 26.0–34.0)
MCHC: 33 g/dL (ref 30.0–36.0)
MCV: 95.1 fL (ref 80.0–100.0)
Monocytes Absolute: 0.4 10*3/uL (ref 0.1–1.0)
Monocytes Relative: 10 %
Neutro Abs: 2.2 10*3/uL (ref 1.7–7.7)
Neutrophils Relative %: 52 %
Platelets: 275 10*3/uL (ref 150–400)
RBC: 3.69 MIL/uL — ABNORMAL LOW (ref 3.87–5.11)
RDW: 13.2 % (ref 11.5–15.5)
WBC: 4.2 10*3/uL (ref 4.0–10.5)
nRBC: 0 % (ref 0.0–0.2)

## 2021-04-11 LAB — COMPREHENSIVE METABOLIC PANEL
ALT: 21 U/L (ref 0–44)
AST: 12 U/L — ABNORMAL LOW (ref 15–41)
Albumin: 4 g/dL (ref 3.5–5.0)
Alkaline Phosphatase: 42 U/L (ref 38–126)
Anion gap: 6 (ref 5–15)
BUN: 9 mg/dL (ref 6–20)
CO2: 25 mmol/L (ref 22–32)
Calcium: 9.1 mg/dL (ref 8.9–10.3)
Chloride: 107 mmol/L (ref 98–111)
Creatinine, Ser: 0.54 mg/dL (ref 0.44–1.00)
GFR, Estimated: >60 mL/min (ref >60)
Glucose, Bld: 101 mg/dL — ABNORMAL HIGH (ref 70–99)
Potassium: 3.5 mmol/L (ref 3.5–5.1)
Sodium: 138 mmol/L (ref 135–145)
Total Bilirubin: 0.2 mg/dL — ABNORMAL LOW (ref 0.3–1.2)
Total Protein: 7.6 g/dL (ref 6.5–8.1)

## 2021-04-11 LAB — HCG, QUANTITATIVE, PREGNANCY: hCG, Beta Chain, Quant, S: 522 m[IU]/mL — ABNORMAL HIGH (ref <5)

## 2021-04-11 LAB — I-STAT BETA HCG BLOOD, ED (MC, WL, AP ONLY): I-stat hCG, quantitative: 492 m[IU]/mL — ABNORMAL HIGH (ref <5)

## 2021-04-11 LAB — LIPASE, BLOOD: Lipase: 27 U/L (ref 11–51)

## 2021-04-11 MED ORDER — CEPHALEXIN 500 MG PO CAPS: 500.0000 mg | ORAL_CAPSULE | Freq: Two times a day (BID) | ORAL | 0 refills | Status: AC

## 2021-04-11 MED ORDER — ACETAMINOPHEN 500 MG PO TABS
1000.0000 mg | ORAL_TABLET | Freq: Once | ORAL | Status: AC
  Administered 2021-04-11: 1000 mg via ORAL
  Filled 2021-04-11: qty 2

## 2021-04-11 MED ORDER — CEPHALEXIN 500 MG PO CAPS
500.0000 mg | ORAL_CAPSULE | Freq: Once | ORAL | Status: AC
  Administered 2021-04-11: 500 mg via ORAL
  Filled 2021-04-11: qty 1

## 2021-04-11 NOTE — ED Triage Notes (Signed)
BIB husband, complains of abdominal pain for the past 5 days, intermittent, at umbilicus. Denies N/V/D or fevers. LMP 11/15.

## 2021-04-11 NOTE — Discharge Instructions (Addendum)
[facility] to the [facility] today to be evaluated for your abdominal pain.  Your pregnancy test was found to be positive.  The ultrasound imaging obtained did not show any intrauterine pregnancy and a heterogeneous mass in your left ovary.  Due to these findings he will need to follow-up with OB/GYN in 48 hours.  They will call to schedule your appointment.  If you do not hear back from them on Monday please call to schedule follow-up appointment.  Your lab work also showed that you have a urinary tract infection.  Due to this she was started on the antibiotic Keflex.  Please take this medication as prescribed.  You make take tylenol, up to 1,000 mg (two extra strength pills) every 8 hours as needed.  Do not take more than 3,000 mg tylenol in a 24 hour period (not more than one dose every 8 hours.  Please check all medication labels as many medications such as pain and cold medications may contain tylenol.    Get help right away if you have: Spotting or bleeding from your vagina. Severe abdominal cramping or pain. Shortness of breath or chest pain. Any kind of trauma, such as from a fall or a car crash. New or increased pain, swelling, or redness in an arm or leg.

## 2021-04-11 NOTE — ED Provider Notes (Signed)
[place] DEPT Provider Note   CSN: [id number] Arrival date & time: 04/11/21  1131     History Chief Complaint  Patient presents with   Abdominal Pain    Allison Tran is a 30 y.o. female with a history of migraines.  Patient is [place] speaker, offered translator however request that husband translate for her.  Presents to the emergency department with a chief complaint of lower quadrant abdominal pain.  States that pain has been intermittent over the last 4 to 5 days.  Pain comes on randomly without any aggravating factors.  Patient has not tried any modalities to alleviate her symptoms.  States the pain usually last approximate 20 minutes before resolving.  Patient denies any pain at present.  Patient denies any fever, chills, nausea, vomiting, abdominal distention, constipation, diarrhea, melena, blood in stool, vaginal pain, vaginal bleeding, vaginal discharge, dysuria, hematuria, urinary urgency, urinary frequency, genital sores or lesions.  LMP 11/18.  G4 P2-0-1-2 her chart review patient had spontaneous miscarriage miscarriage August 2022.   Abdominal Pain Associated symptoms: no chest pain, no chills, no constipation, no diarrhea, no dysuria, no fever, no hematuria, no nausea, no shortness of breath, no vaginal bleeding, no vaginal discharge and no vomiting       Past Medical History:  Diagnosis Date   Chronic low back pain    Migraine     Patient Active Problem List   Diagnosis Date Noted   Chronic migraine 07/21/2016    History reviewed. No pertinent surgical history.   OB History     Gravida  1   Para      Term      Preterm      AB      Living         SAB      IAB      Ectopic      Multiple      Live Births              Family History  Problem Relation Age of Onset   Ulcers Mother    Ulcers Father     Social History   Tobacco Use   Smoking status: Never   Smokeless tobacco: Never   Substance Use Topics   Alcohol use: No   Drug use: No    Home Medications Prior to Admission medications   Medication Sig Start Date End Date Taking? Authorizing Provider  Ferrous Sulfate (IRON) 28 MG TABS Take by mouth. Patient not taking: Reported on 01/13/2021    [provider]  Magnesium Oxide 400 (240 Mg) MG TABS Take 1 tablet (400 mg total) by mouth 2 (two) times daily. Patient not taking: Reported on 01/13/2021 07/21/16   Marcial Pacas, MD  metroNIDAZOLE (FLAGYL) 500 MG tablet Take 1 tablet (500 mg total) by mouth 2 (two) times daily. 01/20/21   Simpson, Danielle L, CNM  naproxen (NAPROSYN) 500 MG tablet Take by mouth. Patient not taking: Reported on 01/13/2021 06/02/16   [provider]  omeprazole (PRILOSEC) 20 MG capsule Take 1 capsule (20 mg total) by mouth daily. Patient not taking: Reported on 01/13/2021 11/16/19   Marcello Fennel, PA-C  propranolol (INDERAL) 40 MG tablet Take 1 tablet (40 mg total) by mouth 2 (two) times daily. Patient not taking: Reported on 01/13/2021 11/24/16   Marcial Pacas, MD  ranitidine (ZANTAC) 150 MG tablet Take 150 mg by mouth. 06/02/16 12/28/20  [provider]  Riboflavin 100 MG CAPS  Take 1 capsule (100 mg total) by mouth 2 (two) times daily. Patient not taking: Reported on 01/13/2021 07/21/16   Marcial Pacas, MD  SUMAtriptan (IMITREX) 100 MG tablet Take 1 tablet (100 mg total) by mouth every 2 (two) hours as needed for migraine. May repeat in 2 hours if headache persists or recurs. Patient not taking: Reported on 01/13/2021 11/24/16   Marcial Pacas, MD    Allergies    Patient has no known allergies.  Review of Systems   Review of Systems  Constitutional:  Negative for chills and fever.  Eyes:  Negative for visual disturbance.  Respiratory:  Negative for shortness of breath.   Cardiovascular:  Negative for chest pain.  Gastrointestinal:  Positive for abdominal pain. Negative for abdominal distention, anal bleeding, blood in stool,  constipation, diarrhea, nausea, rectal pain and vomiting.  Genitourinary:  Negative for decreased urine volume, difficulty urinating, dysuria, flank pain, frequency, genital sores, hematuria, pelvic pain, urgency, vaginal bleeding, vaginal discharge and vaginal pain.  Musculoskeletal:  Negative for back pain and neck pain.  Skin:  Negative for color change and rash.  Neurological:  Negative for dizziness, syncope, light-headedness and headaches.  Psychiatric/Behavioral:  Negative for confusion.    Physical Exam Updated Vital Signs BP 99/66 (BP Location: Left Arm)   Pulse 81   Temp 98.6 F (37 C) (Oral)   Resp 16   Ht _0  (1.651 m)   Wt 81 kg   LMP 03/17/2021 (Approximate)   SpO2 100%   BMI 29.72 kg/m   Physical Exam Vitals and nursing note reviewed.  Constitutional:      General: She is not in acute distress.    Appearance: She is not ill-appearing, toxic-appearing or diaphoretic.  HENT:     Head: Normocephalic.  Eyes:     General: No scleral icterus.       Right eye: No discharge.        Left eye: No discharge.  Cardiovascular:     Rate and Rhythm: Normal rate.  Pulmonary:     Effort: Pulmonary effort is normal.  Abdominal:     General: Abdomen is flat. Bowel sounds are normal. There is no distension. There are no signs of injury.     Palpations: Abdomen is soft. There is no mass or pulsatile mass.     Tenderness: There is abdominal tenderness in the left lower quadrant. There is no right CVA tenderness, left CVA tenderness, guarding or rebound. Negative signs include McBurney's sign and psoas sign.     Hernia: There is no hernia in the umbilical area or ventral area.     Comments: Minimal tenderness to left lower quadrant  Skin:    General: Skin is warm and dry.  Neurological:     General: No focal deficit present.     Mental Status: She is alert.  Psychiatric:        Behavior: Behavior is cooperative.    ED Results / Procedures / Treatments   Labs (all labs  ordered are listed, but only abnormal results are displayed) Labs Reviewed  COMPREHENSIVE METABOLIC PANEL - Abnormal; Notable for the following components:      Result Value   Glucose, Bld 101 (*)    AST 12 (*)    Total Bilirubin 0.2 (*)    All other components within normal limits  CBC WITH DIFFERENTIAL/PLATELET - Abnormal; Notable for the following components:   RBC 3.69 (*)    Hemoglobin 11.6 (*)    HCT 35.1 (*)  All other components within normal limits  URINALYSIS, ROUTINE W REFLEX MICROSCOPIC - Abnormal; Notable for the following components:   APPearance CLOUDY (*)    Hgb urine dipstick MODERATE (*)    Leukocytes,Ua LARGE (*)    WBC, UA >50 (*)    Bacteria, UA MANY (*)    All other components within normal limits  HCG, QUANTITATIVE, PREGNANCY - Abnormal; Notable for the following components:   hCG, Beta Chain, Quant, S 522 (*)    All other components within normal limits  I-STAT BETA HCG BLOOD, ED ([facility], [facility], [facility] ONLY) - Abnormal; Notable for the following components:   I-stat hCG, quantitative 492.0 (*)    All other components within normal limits  URINE CULTURE  LIPASE, BLOOD    EKG None  Radiology US OB Comp < 14 Wks  Result Date: 04/11/2021 CLINICAL DATA:  Rule out ectopic EXAM: OBSTETRIC <14 WK [name] AND TRANSVAGINAL OB US TECHNIQUE: Both transabdominal and transvaginal ultrasound examinations were performed for complete evaluation of the gestation as well as the maternal uterus, adnexal regions, and pelvic cul-de-sac. Transvaginal technique was performed to assess early pregnancy. COMPARISON:  None. FINDINGS: Intrauterine gestational sac: None Yolk sac:  Not Visualized. Embryo:  Not Visualized. Cardiac Activity: Not Visualized. Heart Rate: N/A Subchorionic hemorrhage:  None visualized. Maternal uterus/adnexae: Normal appearance of the right ovary. Heterogeneous masslike area located within the left ovary measuring 1.5 x 1.9 x 1.3 cm. Trace free fluid in the pelvis.  IMPRESSION: 1. No intrauterine gestational sac, yolk sac, or fetal pole identified. In the setting of positive pregnancy test and no definite intrauterine pregnancy, this reflects a pregnancy of unknown location. Differential considerations include early normal IUP, abnormal IUP, or nonvisualized ectopic pregnancy. Differentiation is achieved with serial beta HCG supplemented by repeat sonography as clinically warranted. 2. Indeterminate heterogeneous masslike area located within the left ovary measuring up to 1.5 cm, most likely a corpus luteum cyst, although ovarian ectopic cannot be excluded. Recommend attention on follow-up. Electronically Signed   By: Yetta Glassman M.D.   On: 04/11/2021 17:35   US OB Transvaginal  Result Date: 04/11/2021 CLINICAL DATA:  Rule out ectopic EXAM: OBSTETRIC <14 WK [name] AND TRANSVAGINAL OB US TECHNIQUE: Both transabdominal and transvaginal ultrasound examinations were performed for complete evaluation of the gestation as well as the maternal uterus, adnexal regions, and pelvic cul-de-sac. Transvaginal technique was performed to assess early pregnancy. COMPARISON:  None. FINDINGS: Intrauterine gestational sac: None Yolk sac:  Not Visualized. Embryo:  Not Visualized. Cardiac Activity: Not Visualized. Heart Rate: N/A Subchorionic hemorrhage:  None visualized. Maternal uterus/adnexae: Normal appearance of the right ovary. Heterogeneous masslike area located within the left ovary measuring 1.5 x 1.9 x 1.3 cm. Trace free fluid in the pelvis. IMPRESSION: 1. No intrauterine gestational sac, yolk sac, or fetal pole identified. In the setting of positive pregnancy test and no definite intrauterine pregnancy, this reflects a pregnancy of unknown location. Differential considerations include early normal IUP, abnormal IUP, or nonvisualized ectopic pregnancy. Differentiation is achieved with serial beta HCG supplemented by repeat sonography as clinically warranted. 2. Indeterminate  heterogeneous masslike area located within the left ovary measuring up to 1.5 cm, most likely a corpus luteum cyst, although ovarian ectopic cannot be excluded. Recommend attention on follow-up. Electronically Signed   By: Yetta Glassman M.D.   On: 04/11/2021 17:35    Procedures Procedures   Medications Ordered in ED Medications  cephALEXin (KEFLEX) capsule 500 mg (has no administration in time range)  acetaminophen (TYLENOL) tablet 1,000 mg (1,000 mg Oral Given 04/11/21 1741)    ED Course  I have reviewed the triage vital signs and the nursing notes.  Pertinent labs & imaging results that were available during my care of the patient were reviewed by me and considered in my medical decision making (see chart for details).  Clinical Course as of 04/11/21 1858  Sat Apr 11, 2021  1838 Spoke to Dr. Elonda Husky with OB/GYN reports that patient will need follow-up in 48 hours.  Dr.Eure was given patient's MRN and will have OB/GYN team to schedule follow-up appointment. [PB]    Clinical Course User Index [PB] Dyann Ruddle   MDM Rules/Calculators/A&P                           Alert 30 year old female no acute distress, nontoxic-appearing.  Presents emergency department with chief plaint of lower abdominal pain.  Pain has been intermittent over the last 4 to 5 days.  No pain at present.  Abdomen soft, nondistended, minimal tenderness to left lower quadrant.  No guarding or rebound tenderness.  Negative psoas sign, McBurney sign.  No CVA tenderness bilaterally.  Lipase within normal limits, low suspicion for acute pancreatitis at this time AST, ALT, alk phos, and total bilirubin unremarkable, low suspicion for hepatobiliary disease at this time. CBC shows no leukocytosis.  Mild anemia with hemoglobin 11.6 and hematocrit of 35.1.  Appears baseline for patient.  I-STAT beta-hCG is elevated.  Will obtain quantitative pregnancy test.  Patiently will obtain ultrasound imaging to evaluate  for possible ectopic pregnancy.  Per chart review patient is O+.  Quantitative beta-hCG 522. Ultrasound imaging shows no intrauterine gestational sac, yolk sac, or fetal pole identified.  Indeterminant heterogeneous masslike area located within the left ovary measuring up to 1.5 cm.  Due to findings on ultrasound will reach out to OB/GYN provider consult.  Spoke to Dr.Eure who recommends follow-up in outpatient setting in 48 hours.  OB/GYN team will schedule patient's follow-up.  Urinalysis shows Hgb moderate on dipstick, RBC 0-5, WBC greater than 50, bacteria many, squamous epithelial 21-50.  We will start patient on Keflex for asymptomatic bacteriuria in pregnancy.  Patient and husband given information on follow-up in 59 hours, treatment for urinary tract infection, and strict return precautions.  Discussed results, findings, treatment and follow up. Patient advised of return precautions. Patient verbalized understanding and agreed with plan.   Final Clinical Impression(s) / ED Diagnoses Final diagnoses:  None    Rx / DC Orders ED Discharge Orders     None        Dyann Ruddle 04/11/21 1901    Godfrey Pick, MD 04/13/21 [phone]

## 2021-04-11 NOTE — ED Provider Notes (Signed)
Emergency Medicine Provider Triage Evaluation Note  Allison Tran , a 30 y.o. female  was evaluated in triage.  Pt complains of abd pain that waxes and wanes over past 3-4 days. Wanting preg test, per chart review had a miscarriage in sept. LMP last month.  Review of Systems  Positive: Abd pain Negative: NVD  Physical Exam  BP 99/66 (BP Location: Left Arm)   Pulse 81   Temp 98.6 F (37 C) (Oral)   Resp 16   Ht 5\' 5"  (1.651 m)   Wt 81 kg   LMP 03/17/2021 (Approximate)   SpO2 100%   BMI 29.72 kg/m  Gen:   Awake, no distress   Resp:  Normal effort  MSK:   Moves extremities without difficulty  Other:  No distension, guarding or localized areas of tenderness  Medical Decision Making  Medically screening exam initiated at 11:59 AM.  Appropriate orders placed.  Allison Tran was informed that the remainder of the evaluation will be completed by another provider, this initial triage assessment does not replace that evaluation, and the importance of remaining in the ED until their evaluation is complete.    Melburn Popper, PA-C 04/11/21 1200    14/10/22, MD 04/11/21 1434

## 2021-04-13 LAB — URINE CULTURE: Culture: >=100000 — AB

## 2021-04-14 ENCOUNTER — Telehealth: Payer: Self-pay | Admitting: Emergency Medicine

## 2021-04-14 ENCOUNTER — Other Ambulatory Visit: Payer: BC Managed Care – PPO

## 2021-04-14 NOTE — Telephone Encounter (Signed)
Post ED Visit - Positive Culture Follow-up  Culture report reviewed by antimicrobial stewardship pharmacist: [facility] []  , Pharm.D. []  Enzo Bi, .D., BCPS AQ-ID []  Celedonio Miyamoto, Pharm.D., BCPS []  [address], Pharm.D., BCPS []  Sellers, Garvin Fila.D., BCPS, AAHIVP []  , Pharm.D., BCPS, AAHIVP []  Georgina Pillion, PharmD, BCPS []  , PharmD, BCPS []  [place], PharmD, BCPS []  [place], PharmD []  , PharmD, BCPS []  Estella Husk, PharmD  Pharmacy Team []  Lysle Pearl, PharmD []  , PharmD []  Phillips Climes, PharmD []  , Rph []  Agapito Games) , PharmD []  Verlan Friends, PharmD []  , PharmD []  Mervyn Gay, PharmD []  , PharmD []  Vinnie Level, PharmD []  [facility], PharmD []  , PharmD []  Len Childs, PharmD   Positive urine culture Treated with cephalexin, organism sensitive to the same and no further patient follow-up is required at this time.  04/14/2021, 9:11 AM

## 2021-04-15 ENCOUNTER — Other Ambulatory Visit (INDEPENDENT_AMBULATORY_CARE_PROVIDER_SITE_OTHER): Payer: BC Managed Care – PPO

## 2021-04-15 ENCOUNTER — Other Ambulatory Visit: Payer: Self-pay

## 2021-04-15 VITALS — BP 100/63 | HR 88 | Wt 184.6 lb

## 2021-04-15 DIAGNOSIS — O3680X0 Pregnancy with inconclusive fetal viability, not applicable or unspecified: Secondary | ICD-10-CM

## 2021-04-15 LAB — BETA HCG QUANT (REF LAB): hCG Quant: 3301 m[IU]/mL

## 2021-04-15 NOTE — Progress Notes (Signed)
Pt here today for STAT Beta s/p pregnancy of unknown location.  With Video Interpreter # [phone] pt denies vaginal bleeding and pain. Pt advised that we are drawing blood today to see if her pregnancy hormone levels increases as her U/S could not determine where the location of her pregnancy is.  Pt explained that I will call her with results in about 2 hours.  Pt verbalized understanding.   As of 1721 hcg level not resulted. Notified Dr. Debroah Loop about pt and the need for pt to be called with results and f/u.  Dr. Debroah Loop agreed and chart routed.     Addison Naegeli, RN  04/15/21

## 2021-04-21 ENCOUNTER — Telehealth: Payer: Self-pay

## 2021-04-21 DIAGNOSIS — O3680X0 Pregnancy with inconclusive fetal viability, not applicable or unspecified: Secondary | ICD-10-CM

## 2021-04-21 NOTE — Telephone Encounter (Addendum)
-----   Message from Tereso Newcomer, MD sent at 04/17/2021  1:50 PM EST ----- HCG was 522 on 04/11/21, which increased to 3301 on 04/15/21.  Very appropriate rise.  She needs a ultrasound next week to document IUP and stop the concern about the possible left ovarian ectopic (very unlikely, it is most likely a corpus luteum cyst).  Please call to inform patient of results and recommendations.    Jaynie Collins, MD, FACOG Obstetrician & Gynecologist, [facility] for [facility], [facility]  ----------------------------------------------------  [name] scheduled for 04/29/21 @ 11 AM. Jeanene Erb pt with [facility] interpreter ID [phone]. Patient's husband answered the phone and states he works 6a-6p. This is the only phone available. Explained I am unable to give test results to him and need to speak directly to patient. He states she does have MyChart and they can review these results together. Encouraged husband to have patient call us if possible. MyChart message sent.

## 2021-04-21 NOTE — Telephone Encounter (Signed)
Patient's husband called office to give phone number where patient can be reached. Called pt with [facility] interpreter [phone]. Results and provider recommendation reviewed.

## 2021-04-29 ENCOUNTER — Other Ambulatory Visit: Payer: Self-pay

## 2021-04-29 ENCOUNTER — Telehealth: Payer: Self-pay | Admitting: Medical

## 2021-04-29 ENCOUNTER — Ambulatory Visit
Admission: RE | Admit: 2021-04-29 | Discharge: 2021-04-29 | Disposition: A | Discharge status: Home / Self Care | Payer: BC Managed Care – PPO | Source: Ambulatory Visit | Attending: Obstetrics & Gynecology | Admitting: Obstetrics & Gynecology

## 2021-04-29 DIAGNOSIS — O3680X0 Pregnancy with inconclusive fetal viability, not applicable or unspecified: Secondary | ICD-10-CM | POA: Insufficient documentation

## 2021-04-29 NOTE — Telephone Encounter (Signed)
I called Allison Tran today at 3:40 PM and confirmed patient's identity using two patient identifiers with the interpreter. [name] results from earlier today were reviewed. Patient is scheduled for new OB visit at [facility]. First trimester warning signs reviewed. Patient voiced understanding and had no further questions.   US OB Transvaginal  Result Date: 04/29/2021 CLINICAL DATA:  Pregnancy of unknown location EXAM: TRANSVAGINAL OB ULTRASOUND TECHNIQUE: Transvaginal ultrasound was performed for complete evaluation of the gestation as well as the maternal uterus, adnexal regions, and pelvic cul-de-sac. COMPARISON:  Obstetric ultrasound 04/11/2021 FINDINGS: Intrauterine gestational sac: Single Yolk sac:  Visualized. Embryo:  Visualized. Cardiac Activity: Visualized. Heart Rate: 130 bpm CRL: 5.9 mm   6 w 3 d                  [name] EDC: 12/20/2021 Subchorionic hemorrhage:  None visualized. Maternal uterus/adnexae: The right ovary is normal in appearance measuring 2.4 cm x 7.6 cm x 1.2 cm. The left ovary measures 2.8 cm x 1.8 cm x 7.1 cm. A probable corpus luteum cyst is again noted, though this is less defined than on the prior study. There is trace free fluid in the pelvis. IMPRESSION: 1. Single live intrauterine pregnancy identified with an estimated gestational age of [redacted] weeks 3 days by crown-rump length. 2. Probable left corpus luteum cyst. Electronically Signed   By: Lesia Hausen M.D.   On: 04/29/2021 11:43    Marny Lowenstein, PA-C 04/29/2021 3:40 PM

## 2021-05-06 NOTE — Progress Notes (Signed)
Patient was assessed and managed by nursing staff during this encounter. I have reviewed the chart and agree with the documentation and plan. I have also made any necessary editorial changes.  Scheryl Darter, MD 05/06/2021 10:43 AM

## 2021-12-22 IMAGING — US US OB COMP LESS 14 WK
1 series · 13 of 28 positions shown · non-contrast
Comparison: None.

CLINICAL DATA: Rule out ectopic

EXAM:
OBSTETRIC <14 WK US AND TRANSVAGINAL OB US
TECHNIQUE: Both transabdominal and transvaginal ultrasound examinations were
performed for complete evaluation of the gestation as well as the
maternal uterus, adnexal regions, and pelvic cul-de-sac.
Transvaginal technique was performed to assess early pregnancy.

[Series 1: us ob comp less 14 wk · 13 of 101 slices shown]
[im 4/101]
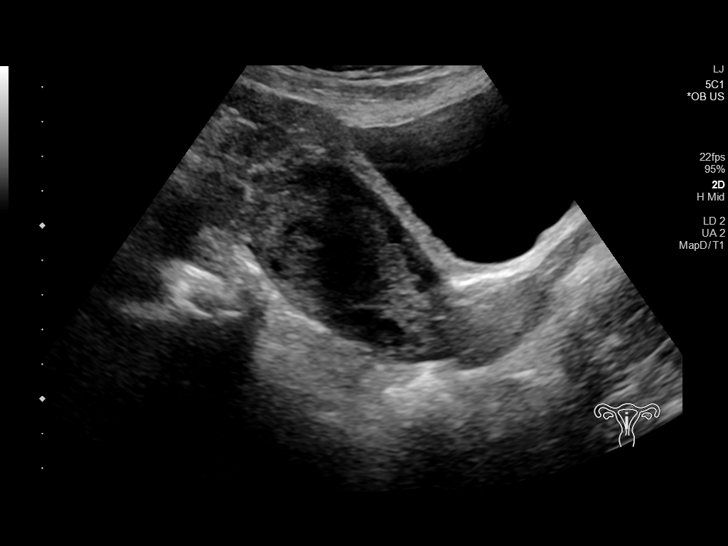
[im 12/101]
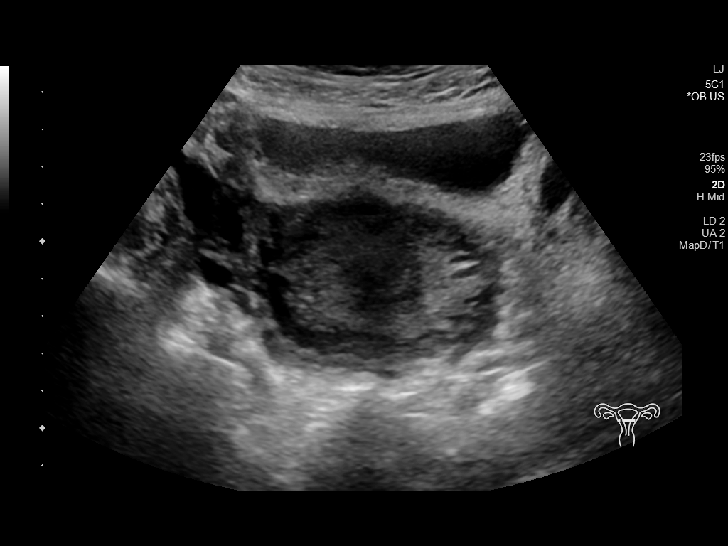
[im 19/101]
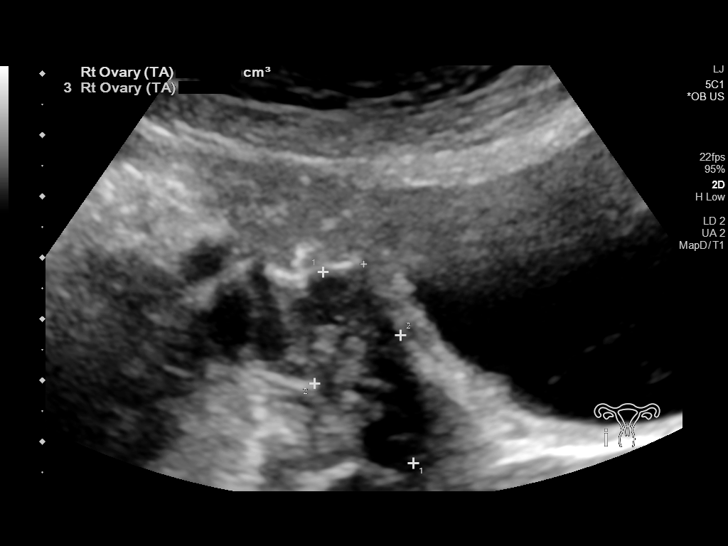
[im 26/101]
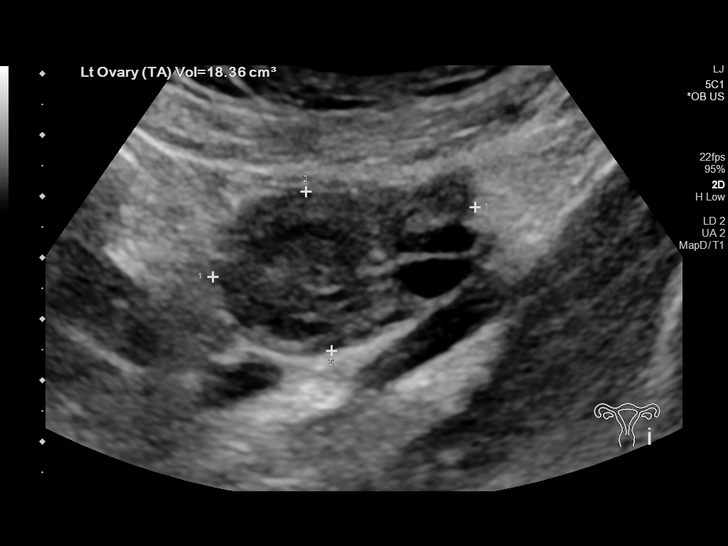
[im 34/101]
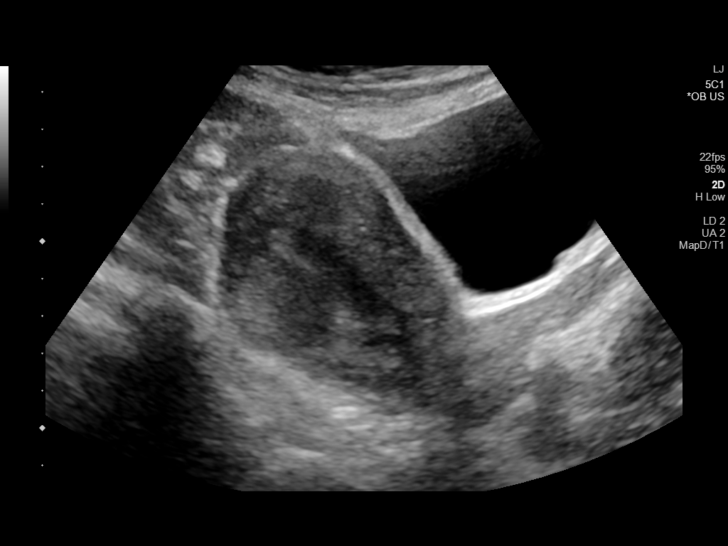
[im 41/101]
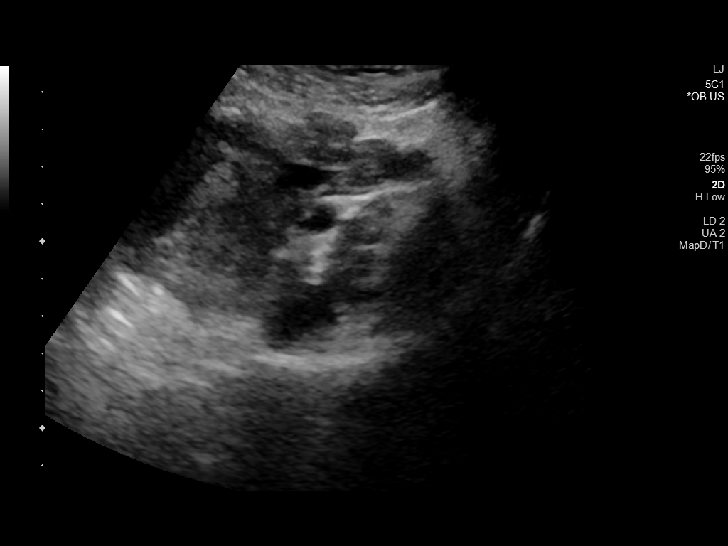
[im 52/101]
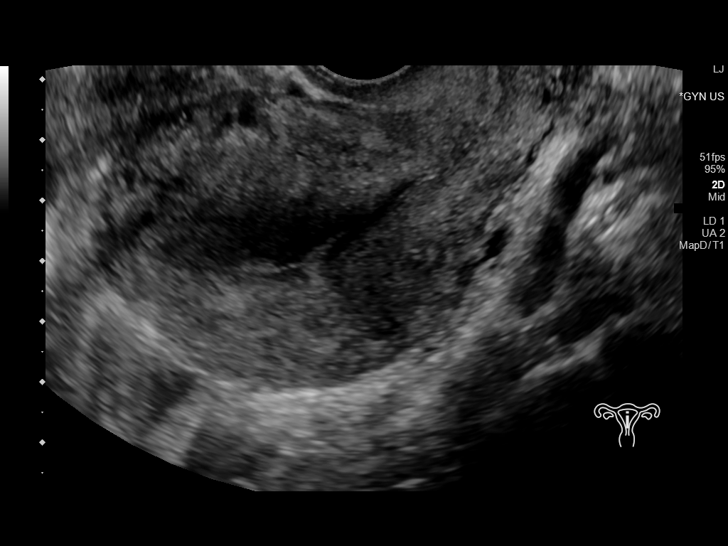
[im 60/101]
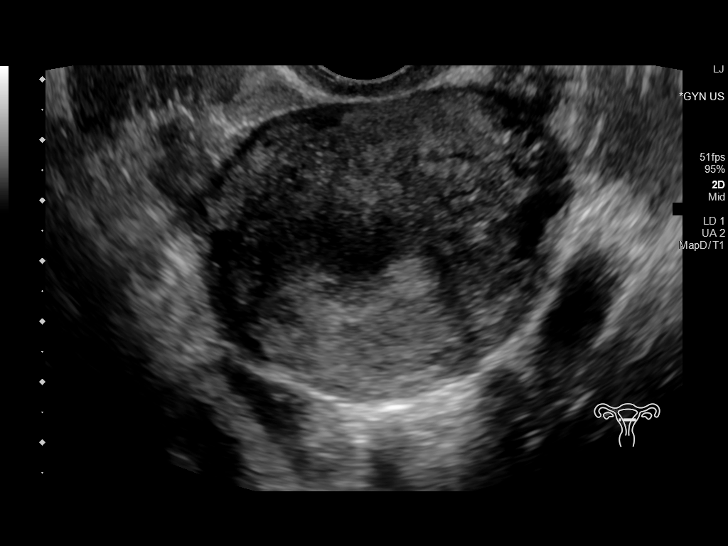
[im 67/101]
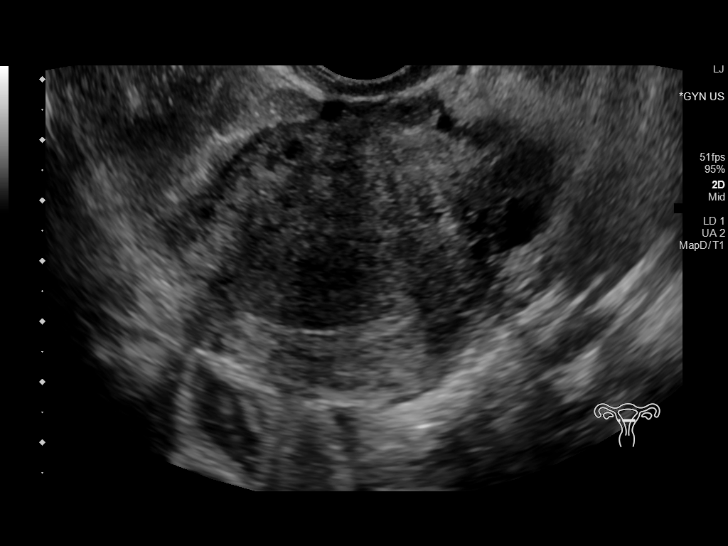
[im 75/101]
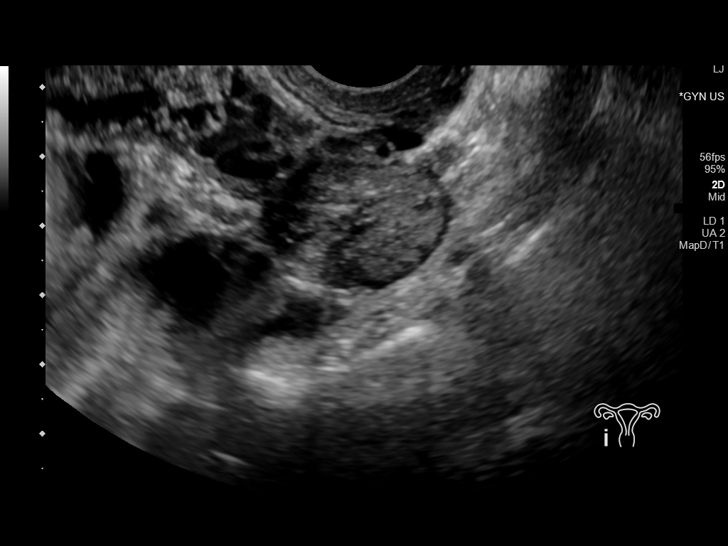
[im 82/101]
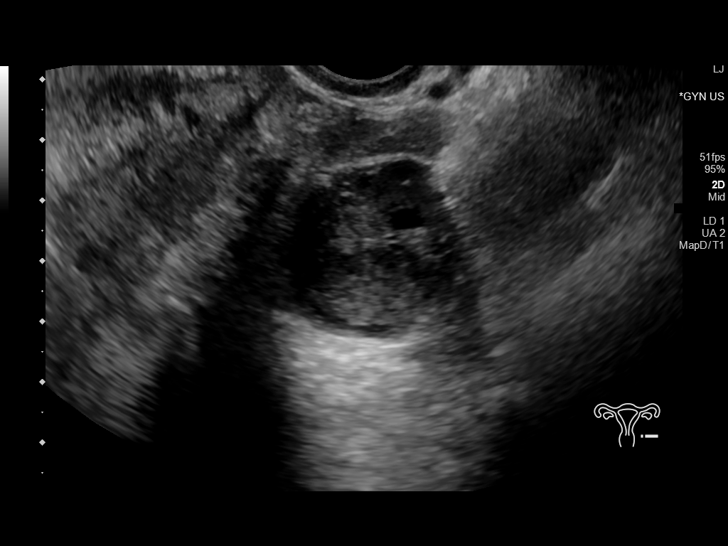
[im 89/101]
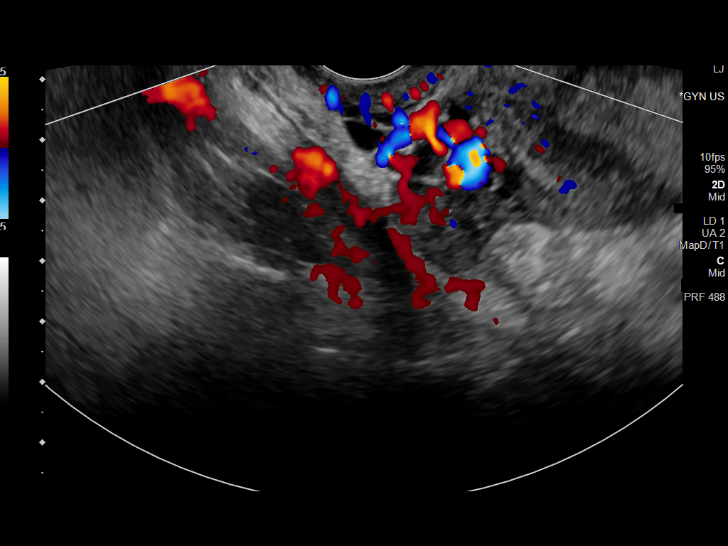
[im 97/101]
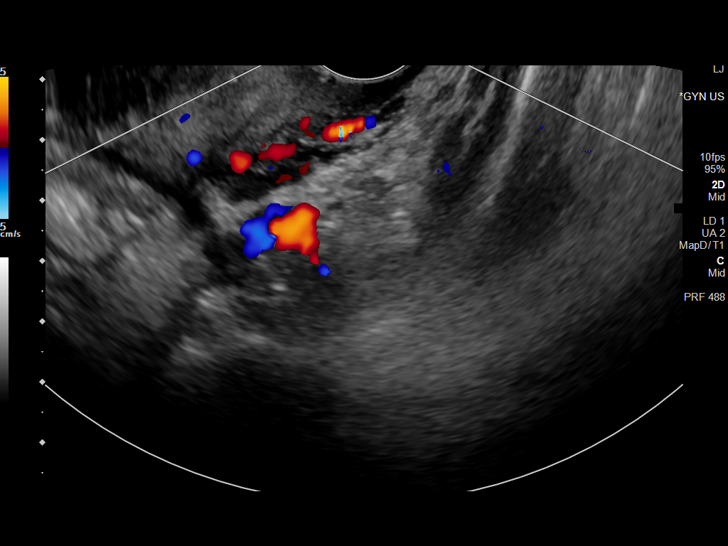

[13 of 28 positions shown; findings below may reference images not displayed]

FINDINGS: Intrauterine gestational sac: None

Yolk sac:  Not Visualized.

Embryo:  Not Visualized.

Cardiac Activity: Not Visualized.

Heart Rate: N/A

Subchorionic hemorrhage:  None visualized.

Maternal uterus/adnexae: Normal appearance of the right ovary.
Heterogeneous masslike area located within the left ovary measuring
1.5 x 1.9 x 1.3 cm. Trace free fluid in the pelvis.
IMPRESSION: 1. No intrauterine gestational sac, yolk sac, or fetal pole
identified. In the setting of positive pregnancy test and no
definite intrauterine pregnancy, this reflects a pregnancy of
unknown location. Differential considerations include early normal
IUP, abnormal IUP, or nonvisualized ectopic pregnancy.
Differentiation is achieved with serial beta HCG supplemented by
repeat sonography as clinically warranted.
2. Indeterminate heterogeneous masslike area located within the left
ovary measuring up to 1.5 cm, most likely a corpus luteum cyst,
although ovarian ectopic cannot be excluded. Recommend attention on
follow-up.

## 2022-01-09 IMAGING — US US OB TRANSVAGINAL
1 series · 15 of 28 positions shown · non-contrast
Comparison: Obstetric ultrasound 04/11/2021

CLINICAL DATA: Pregnancy of unknown location

EXAM:
TRANSVAGINAL OB ULTRASOUND
TECHNIQUE: Transvaginal ultrasound was performed for complete evaluation of the
gestation as well as the maternal uterus, adnexal regions, and
pelvic cul-de-sac.

[Series 1: us ob transvaginal · 15 of 49 slices shown]
[im 1/49]
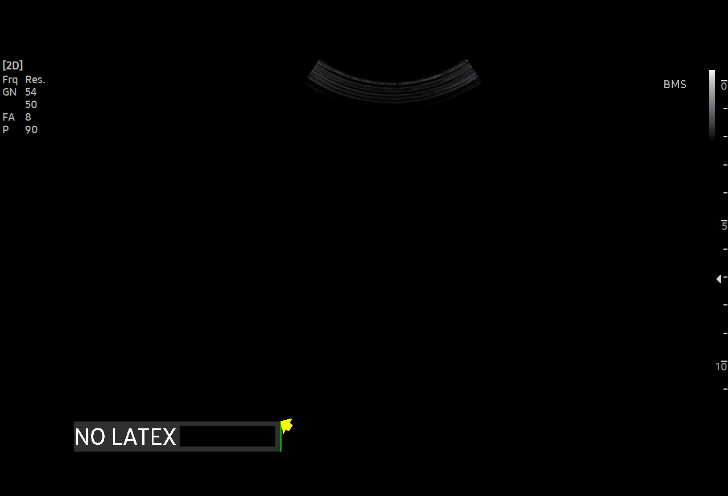
[im 4/49]
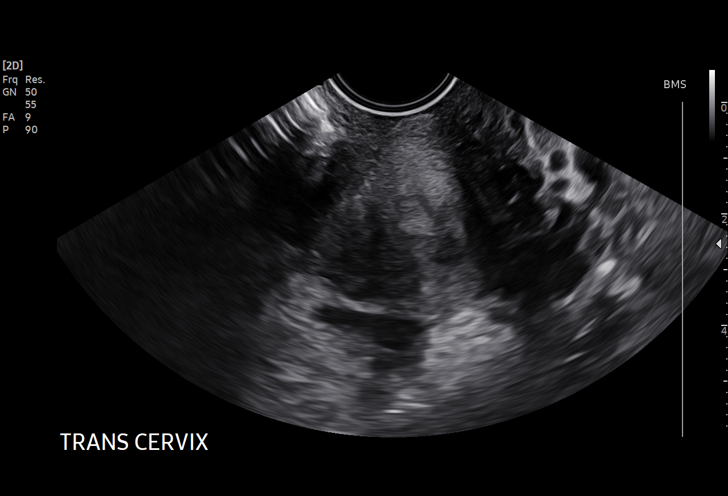
[im 8/49]
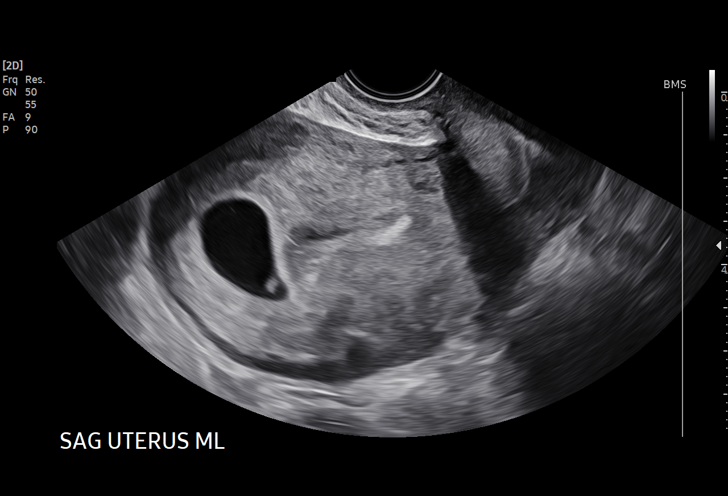
[im 11/49]
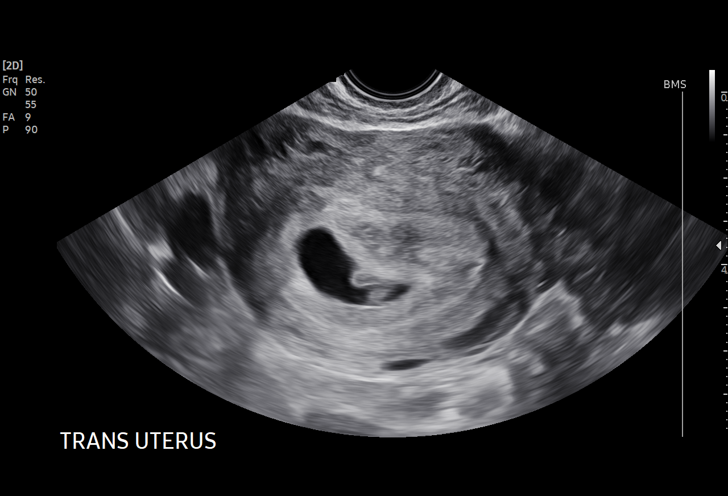
[im 15/49]
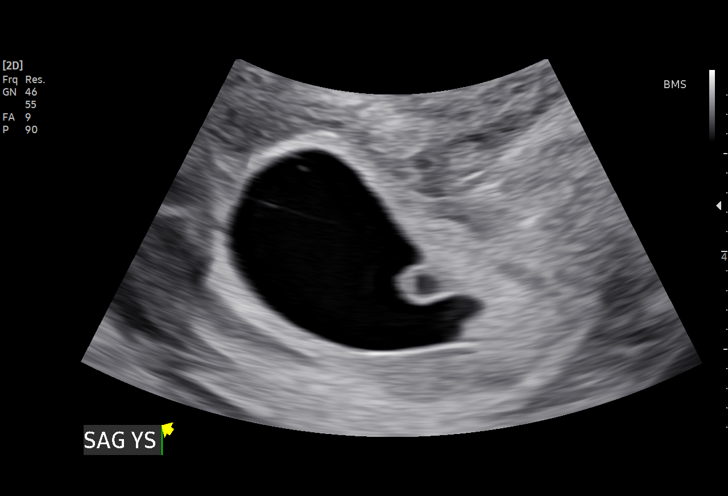
[im 18/49]
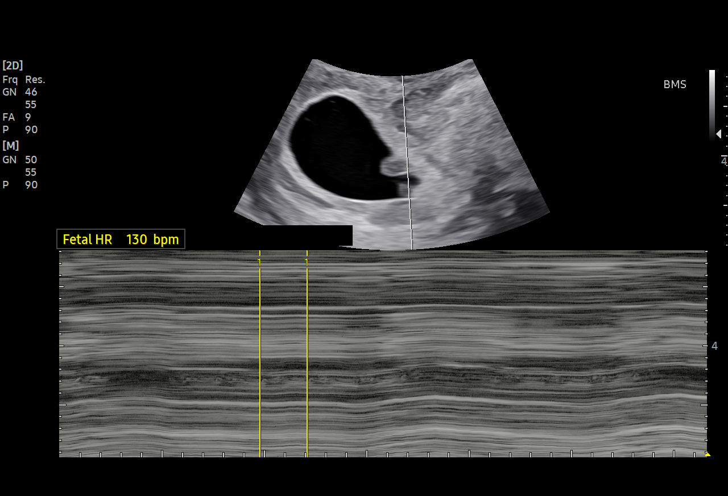
[im 22/49]
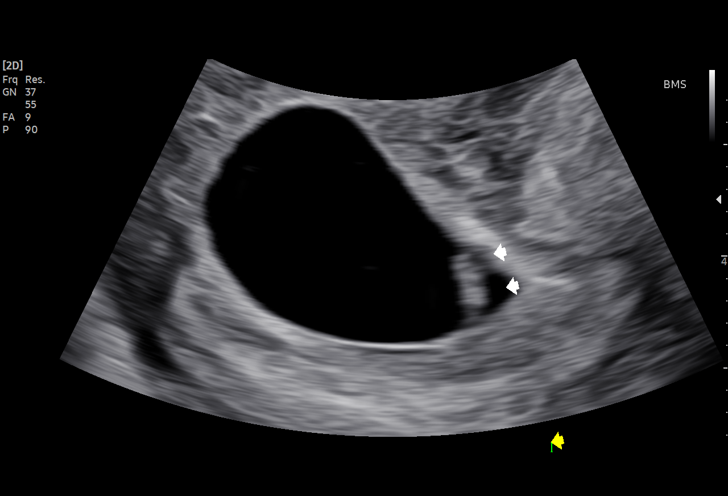
[im 25/49]
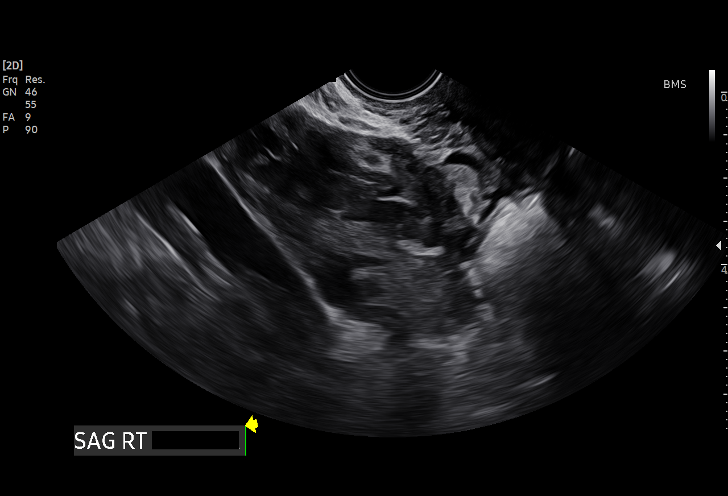
[im 27/49]
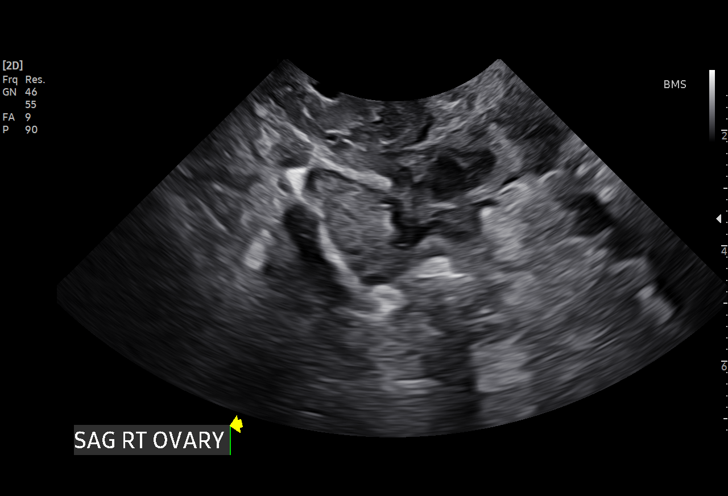
[im 31/49]
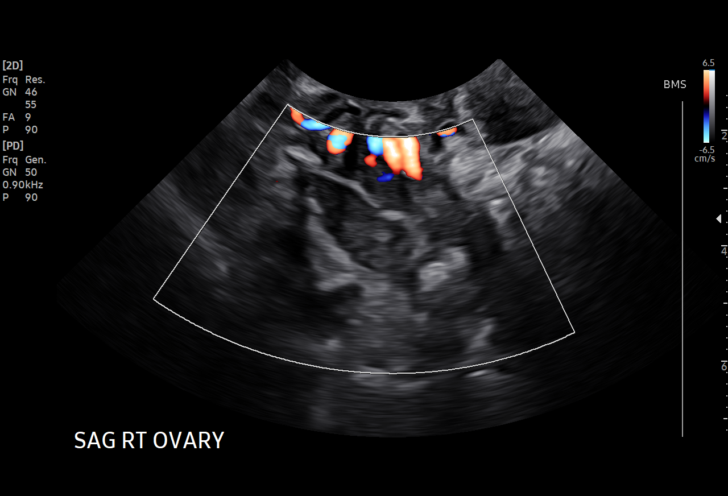
[im 34/49]
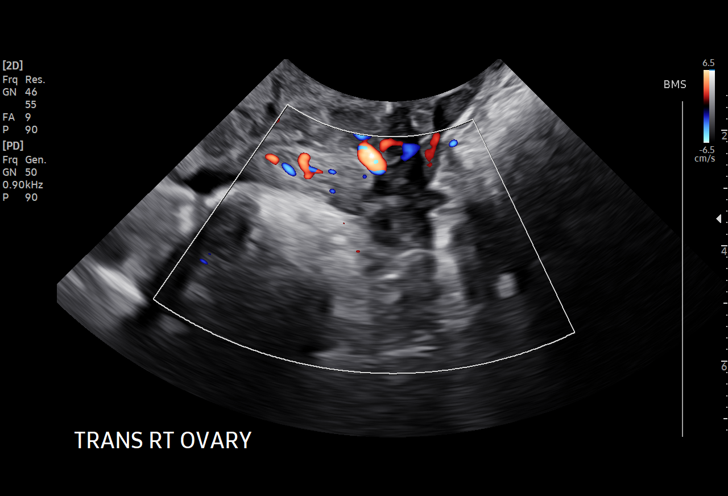
[im 38/49]
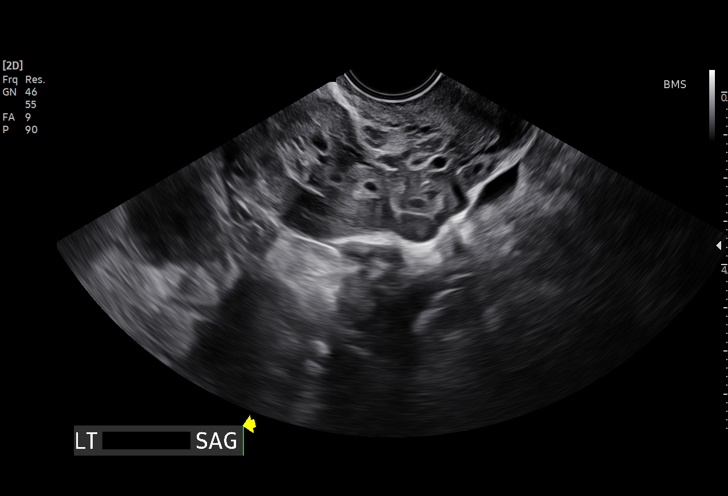
[im 41/49]
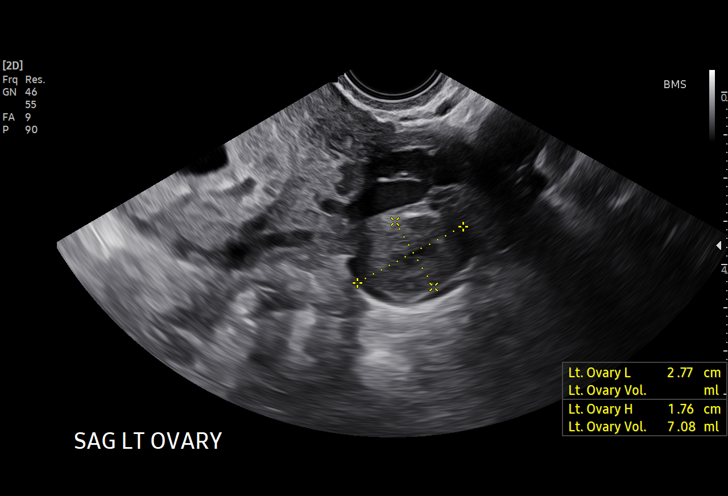
[im 45/49]
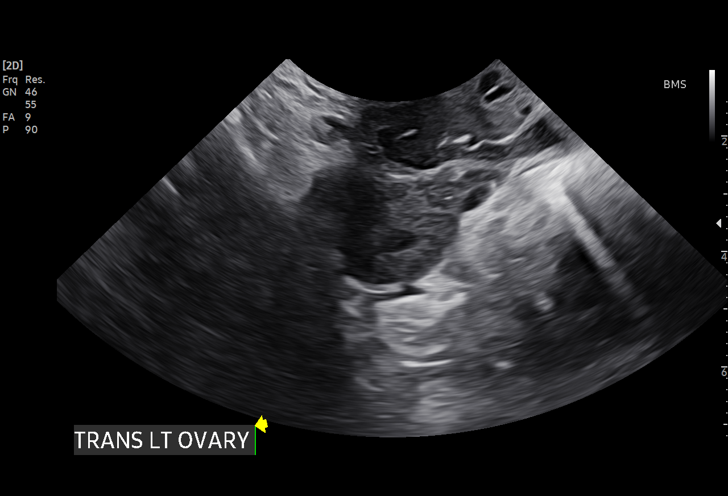
[im 49/49]
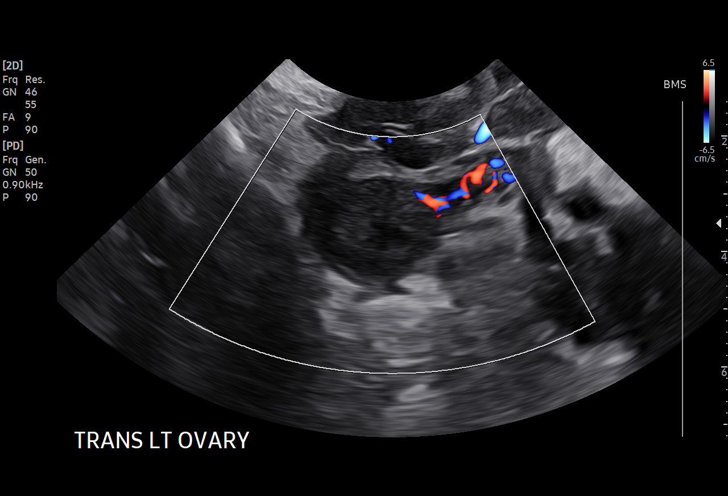

[15 of 28 positions shown; findings below may reference images not displayed]

FINDINGS: Intrauterine gestational sac: Single

Yolk sac:  Visualized.

Embryo:  Visualized.

Cardiac Activity: Visualized.

Heart Rate: 130 bpm

CRL: 5.9 mm   6 w 3 d                  US EDC: 12/20/2021

Subchorionic hemorrhage:  None visualized.

Maternal uterus/adnexae: The right ovary is normal in appearance
measuring 2.4 cm x 7.6 cm x 1.2 cm. The left ovary measures 2.8 cm x
1.8 cm x 7.1 cm. A probable corpus luteum cyst is again noted,
though this is less defined than on the prior study. There is trace
free fluid in the pelvis.
IMPRESSION: 1. Single live intrauterine pregnancy identified with an estimated
gestational age of 6 weeks 3 days by crown-rump length.
2. Probable left corpus luteum cyst.

## 2023-08-29 ENCOUNTER — Ambulatory Visit (HOSPITAL_BASED_OUTPATIENT_CLINIC_OR_DEPARTMENT_OTHER): Admission: EM | Admit: 2023-08-29 | Discharge: 2023-08-29 | Disposition: A | Discharge status: Home / Self Care

## 2023-08-29 ENCOUNTER — Encounter (HOSPITAL_BASED_OUTPATIENT_CLINIC_OR_DEPARTMENT_OTHER): Payer: Self-pay | Admitting: Emergency Medicine

## 2023-08-29 DIAGNOSIS — J0140 Acute pansinusitis, unspecified: Secondary | ICD-10-CM

## 2023-08-29 DIAGNOSIS — H66002 Acute suppurative otitis media without spontaneous rupture of ear drum, left ear: Secondary | ICD-10-CM

## 2023-08-29 MED ORDER — DOXYCYCLINE HYCLATE 100 MG PO CAPS: 100.0000 mg | ORAL_CAPSULE | Freq: Two times a day (BID) | ORAL | 0 refills | Status: DC

## 2023-08-29 MED ORDER — FLUTICASONE PROPIONATE 50 MCG/ACT NA SUSP: 1.0000 | Freq: Two times a day (BID) | NASAL | 0 refills | Status: DC | PRN

## 2023-08-29 NOTE — Discharge Instructions (Addendum)
 Sinusitis: Patient has a sinus infection.  Encouraged over-the-counter sinus rinses twice daily.  She can use the bottle or the Nettie pot.  Fluticasone nasal spray, 1 spray into each nostril once or twice daily for nasal congestion.  Doxycycline 100 mg twice daily for 10 days.  Get plenty of fluids and rest.  Left ear infection: Patient is getting fluticasone nasal spray for nasal congestion for her sinusitis infection.  She is also getting doxycycline for the sinusitis.  Both the fluticasone and the doxycycline will help resolve the left ear infection.  Get plenty of fluids and rest.  Patient needs to have a recheck of her left ear to make sure the ear infection resolves in about 3 weeks.  She can make an appointment and come here or she can see her family doctor.  Return sooner if symptoms do not improve, worsen or new symptoms occur.

## 2023-08-29 NOTE — ED Provider Notes (Signed)
 [facility]    CSN: [id number] Arrival date & time: 08/29/23  1758      History   Chief Complaint No chief complaint on file.   HPI [name] is a 33 y.o. female.   Patient is here with her husband.  Patient reports runny nose, congestion, sinus pressure and pain.  Symptoms for 3 to 4 days since 08/25/2023.  She also has some left ear pain and a sore throat.  She denies fever, nausea, vomiting, diarrhea, constipation.     Past Medical History:  Diagnosis Date   Chronic low back pain    Migraine     Patient Active Problem List   Diagnosis Date Noted   Chronic migraine 07/21/2016    History reviewed. No pertinent surgical history.  OB History     Gravida  1   Para      Term      Preterm      AB      Living         SAB      IAB      Ectopic      Multiple      Live Births               Home Medications    Prior to Admission medications   Medication Sig Start Date End Date Taking? Authorizing Provider  doxycycline (VIBRAMYCIN) 100 MG capsule Take 1 capsule (100 mg total) by mouth 2 (two) times daily. 08/29/23  Yes Guss Legacy, FNP  fluticasone (FLONASE) 50 MCG/ACT nasal spray Place 1 spray into both nostrils 2 (two) times daily as needed for rhinitis. 08/29/23 09/28/23 Yes Guss Legacy, FNP  acetaminophen  (TYLENOL ) 500 MG tablet Take 500 mg by mouth every 6 (six) hours as needed.    [provider]  metroNIDAZOLE  (FLAGYL ) 500 MG tablet Take 1 tablet (500 mg total) by mouth 2 (two) times daily. 01/20/21   Alric Jensen, CNM  ranitidine (ZANTAC) 150 MG tablet Take 150 mg by mouth. 06/02/16 12/28/20  [provider]    Family History Family History  Problem Relation Age of Onset   Ulcers Mother    Ulcers Father     Social History Social History   Tobacco Use   Smoking status: Never   Smokeless tobacco: Never  Substance Use Topics   Alcohol use: No   Drug use: No     Allergies    Patient has no known allergies.   Review of Systems Review of Systems  Constitutional:  Negative for chills and fever.  HENT:  Positive for congestion, ear pain, postnasal drip, rhinorrhea, sinus pressure, sinus pain and sore throat.   Eyes:  Negative for pain and visual disturbance.  Respiratory:  Negative for cough and shortness of breath.   Cardiovascular:  Negative for chest pain and palpitations.  Gastrointestinal:  Negative for abdominal pain, constipation, diarrhea, nausea and vomiting.  Genitourinary:  Negative for dysuria and hematuria.  Musculoskeletal:  Negative for arthralgias and back pain.  Skin:  Negative for color change and rash.  Neurological:  Negative for seizures and syncope.  All other systems reviewed and are negative.    Physical Exam Triage Vital Signs ED Triage Vitals  Encounter Vitals Group     BP 08/29/23 1930 113/77     Systolic BP Percentile --      Diastolic BP Percentile --      Pulse Rate 08/29/23 1930 97     Resp  08/29/23 1930 18     Temp 08/29/23 1930 99 F (37.2 C)     Temp Source 08/29/23 1930 Oral     SpO2 08/29/23 1930 99 %     Weight --      Height --      Head Circumference --      Peak Flow --      Pain Score 08/29/23 1927 0     Pain Loc --      Pain Education --      Exclude from Growth Chart --    No data found.  Updated Vital Signs BP 113/77 (BP Location: Right Arm)   Pulse 97   Temp 99 F (37.2 C) (Oral)   Resp 18   LMP 08/14/2023   SpO2 99%   Visual Acuity Right Eye Distance:   Left Eye Distance:   Bilateral Distance:    Right Eye Near:   Left Eye Near:    Bilateral Near:     Physical Exam Vitals and nursing note reviewed.  Constitutional:      General: She is not in acute distress.    Appearance: She is well-developed. She is not ill-appearing or toxic-appearing.  HENT:     Head: Normocephalic and atraumatic.     Right Ear: Hearing, tympanic membrane, ear canal and external ear normal.     Left Ear:  Hearing, ear canal and external ear normal. A middle ear effusion is present. Tympanic membrane is erythematous.     Nose: Congestion and rhinorrhea present. Rhinorrhea is clear.     Right Sinus: Maxillary sinus tenderness and frontal sinus tenderness present.     Left Sinus: Maxillary sinus tenderness and frontal sinus tenderness present.     Mouth/Throat:     Lips: Pink.     Mouth: Mucous membranes are moist.     Pharynx: Uvula midline. No oropharyngeal exudate or posterior oropharyngeal erythema.     Tonsils: No tonsillar exudate.  Eyes:     Conjunctiva/sclera: Conjunctivae normal.     Pupils: Pupils are equal, round, and reactive to light.  Cardiovascular:     Rate and Rhythm: Normal rate and regular rhythm.     Heart sounds: S1 normal and S2 normal. No murmur heard. Pulmonary:     Effort: Pulmonary effort is normal. No respiratory distress.     Breath sounds: Normal breath sounds. No decreased breath sounds, wheezing, rhonchi or rales.  Abdominal:     General: Bowel sounds are normal.     Palpations: Abdomen is soft.     Tenderness: There is no abdominal tenderness.  Musculoskeletal:        General: No swelling.     Cervical back: Neck supple.  Lymphadenopathy:     Head:     Right side of head: No submental, submandibular, tonsillar, preauricular or posterior auricular adenopathy.     Left side of head: No submental, submandibular, tonsillar, preauricular or posterior auricular adenopathy.     Cervical: Cervical adenopathy present.     Right cervical: Superficial cervical adenopathy present.     Left cervical: Superficial cervical adenopathy present.  Skin:    General: Skin is warm and dry.     Capillary Refill: Capillary refill takes less than 2 seconds.     Findings: No rash.  Neurological:     Mental Status: She is alert and oriented to person, place, and time.  Psychiatric:        Mood and Affect: Mood normal.  UC Treatments / Results  Labs (all labs ordered  are listed, but only abnormal results are displayed) Labs Reviewed - No data to display  EKG   Radiology No results found.  Procedures Procedures (including critical care time)  Medications Ordered in UC Medications - No data to display  Initial Impression / Assessment and Plan / UC Course  I have reviewed the triage vital signs and the nursing notes.  Pertinent labs & imaging results that were available during my care of the patient were reviewed by me and considered in my medical decision making (see chart for details).     Plan of Care: Sinusitis: Encouraged sinus rinses.  Could use the bottle or the Nettie pot.  Doxycycline 100 mg twice daily for 10 days.  Fluticasone nasal spray, 1 spray into each nostril once or twice daily for nasal congestion.  Follow-up if symptoms do not improve, worsen or new symptoms occur. Left ear infection: Treatment for sinusitis will also manage and treat the ear infection.  Follow-up as needed.  Encouraged to see family doctor or return here in 3 weeks for recheck of ears. Final Clinical Impressions(s) / UC Diagnoses   Final diagnoses:  Non-recurrent acute suppurative otitis media of left ear without spontaneous rupture of tympanic membrane  Acute non-recurrent pansinusitis     Discharge Instructions      Sinusitis: Patient has a sinus infection.  Encouraged over-the-counter sinus rinses twice daily.  She can use the bottle or the Nettie pot.  Fluticasone nasal spray, 1 spray into each nostril once or twice daily for nasal congestion.  Doxycycline 100 mg twice daily for 10 days.  Get plenty of fluids and rest.  Left ear infection: Patient is getting fluticasone nasal spray for nasal congestion for her sinusitis infection.  She is also getting doxycycline for the sinusitis.  Both the fluticasone and the doxycycline will help resolve the left ear infection.  Get plenty of fluids and rest.  Patient needs to have a recheck of her left ear to make  sure the ear infection resolves in about 3 weeks.  She can make an appointment and come here or she can see her family doctor.  Return sooner if symptoms do not improve, worsen or new symptoms occur.     ED Prescriptions     Medication Sig Dispense Auth. Provider   fluticasone (FLONASE) 50 MCG/ACT nasal spray Place 1 spray into both nostrils 2 (two) times daily as needed for rhinitis. 17 mL Guss Legacy, FNP   doxycycline (VIBRAMYCIN) 100 MG capsule Take 1 capsule (100 mg total) by mouth 2 (two) times daily. 20 capsule Guss Legacy, FNP      PDMP not reviewed this encounter.   Guss Legacy, FNP 08/29/23 Gerry Krone

## 2023-08-29 NOTE — ED Triage Notes (Signed)
 Pt c/o coughing, sinus congestion, headache, fever started 2 days ago.

## 2023-08-30 ENCOUNTER — Ambulatory Visit (HOSPITAL_BASED_OUTPATIENT_CLINIC_OR_DEPARTMENT_OTHER)
Admission: EM | Admit: 2023-08-30 | Discharge: 2023-08-30 | Disposition: A | Discharge status: Home / Self Care | Attending: Family Medicine | Admitting: Family Medicine

## 2023-08-30 ENCOUNTER — Encounter (HOSPITAL_BASED_OUTPATIENT_CLINIC_OR_DEPARTMENT_OTHER): Payer: Self-pay | Admitting: Emergency Medicine

## 2023-08-30 DIAGNOSIS — J0140 Acute pansinusitis, unspecified: Secondary | ICD-10-CM

## 2023-08-30 DIAGNOSIS — T3795XA Adverse effect of unspecified systemic anti-infective and antiparasitic, initial encounter: Secondary | ICD-10-CM | POA: Diagnosis not present

## 2023-08-30 DIAGNOSIS — H66002 Acute suppurative otitis media without spontaneous rupture of ear drum, left ear: Secondary | ICD-10-CM

## 2023-08-30 MED ORDER — AZITHROMYCIN 250 MG PO TABS: ORAL_TABLET | ORAL | 0 refills | Status: DC

## 2023-08-30 MED ORDER — CETIRIZINE HCL 10 MG PO TABS: 10.0000 mg | ORAL_TABLET | Freq: Every day | ORAL | 0 refills | Status: DC | PRN

## 2023-08-30 MED ORDER — METHYLPREDNISOLONE ACETATE 80 MG/ML IJ SUSP
80.0000 mg | Freq: Once | INTRAMUSCULAR | Status: AC
  Administered 2023-08-30: 80 mg via INTRAMUSCULAR

## 2023-08-30 NOTE — Discharge Instructions (Addendum)
 Sinusitis: Stop the doxycycline and discard it.  Azithromycin, 250 mg, 2 pills today then 1 pill daily for 4 days.  Encouraged sinus rinses.  Depo-Medrol, 80 mg, injection given in the office today.  This is a steroid injection and will help her allergy reaction to the previous antibiotic, it will help her sinus infection and it will help her ear infection.  Get plenty of fluids and rest.  Ear infection on the left: Ear infection should resolve with use of azithromycin and prednisone.  See sinusitis for more information about these medications.  Allergic reaction to doxycycline: Stop the doxycycline.  Doxycycline is now listed as an allergy in her chart.  She was given prednisone for ear infection and sinusitis and the prednisone will also help with the allergic reaction.  Add cetirizine, 10 mg once daily for itching and allergic reaction.  Follow-up if symptoms do not improve, worsen or new symptoms occur.

## 2023-08-30 NOTE — ED Provider Notes (Signed)
 [facility]    CSN: [id number] Arrival date & time: 08/30/23  1929      History   Chief Complaint Chief Complaint  Patient presents with   Medication Reaction    HPI Allison Tran is a 33 y.o. female.   Patient is here with her husband.  She was seen yesterday, 08/29/23.  She was diagnosed with sinusitis and left otitis media.  She was treated with Doxycycline and Fluticasone nasal spray.  She returns today with whelps/wheels all over her arms, chest, abdomen, back and legs.  The rash itches.   She still has runny nose, cough, congestion and ear pain.     Past Medical History:  Diagnosis Date   Chronic low back pain    Migraine     Patient Active Problem List   Diagnosis Date Noted   Chronic migraine 07/21/2016    History reviewed. No pertinent surgical history.  OB History     Gravida  1   Para      Term      Preterm      AB      Living         SAB      IAB      Ectopic      Multiple      Live Births               Home Medications    Prior to Admission medications   Medication Sig Start Date End Date Taking? Authorizing Provider  azithromycin (ZITHROMAX) 250 MG tablet Take first 2 tablets today and then 1 tablet daily x 4 days. 08/30/23  Yes Guss Legacy, FNP  cetirizine (ZYRTEC) 10 MG tablet Take 1 tablet (10 mg total) by mouth daily as needed for allergies or rhinitis (itching). 08/30/23  Yes Guss Legacy, FNP  acetaminophen  (TYLENOL ) 500 MG tablet Take 500 mg by mouth every 6 (six) hours as needed.    [provider]  fluticasone (FLONASE) 50 MCG/ACT nasal spray Place 1 spray into both nostrils 2 (two) times daily as needed for rhinitis. 08/29/23 09/28/23  Guss Legacy, FNP    Family History Family History  Problem Relation Age of Onset   Ulcers Mother    Ulcers Father     Social History Social History   Tobacco Use   Smoking status: Never   Smokeless tobacco: Never  Substance Use Topics    Alcohol use: No   Drug use: No     Allergies   Doxycycline   Review of Systems Review of Systems  Constitutional:  Negative for chills and fever.  HENT:  Positive for congestion, postnasal drip, rhinorrhea, sinus pressure and sinus pain. Negative for ear pain and sore throat.   Eyes:  Negative for pain and visual disturbance.  Respiratory:  Positive for cough. Negative for shortness of breath.   Cardiovascular:  Negative for chest pain and palpitations.  Gastrointestinal:  Negative for abdominal pain, constipation, diarrhea, nausea and vomiting.  Genitourinary:  Negative for dysuria and hematuria.  Musculoskeletal:  Negative for arthralgias and back pain.  Skin:  Positive for rash. Negative for color change.  Neurological:  Negative for seizures and syncope.  All other systems reviewed and are negative.    Physical Exam Triage Vital Signs ED Triage Vitals  Encounter Vitals Group     BP 08/30/23 2001 100/69     Systolic BP Percentile --      Diastolic BP Percentile --  Pulse Rate 08/30/23 2001 (!) 102     Resp 08/30/23 2001 16     Temp 08/30/23 2001 99.4 F (37.4 C)     Temp Source 08/30/23 2001 Oral     SpO2 08/30/23 2001 98 %     Weight --      Height --      Head Circumference --      Peak Flow --      Pain Score 08/30/23 1959 0     Pain Loc --      Pain Education --      Exclude from Growth Chart --    No data found.  Updated Vital Signs BP 100/69 (BP Location: Right Arm)   Pulse (!) 102   Temp 99.4 F (37.4 C) (Oral)   Resp 16   LMP 08/14/2023   SpO2 98%   Visual Acuity Right Eye Distance:   Left Eye Distance:   Bilateral Distance:    Right Eye Near:   Left Eye Near:    Bilateral Near:     Physical Exam   UC Treatments / Results  Labs (all labs ordered are listed, but only abnormal results are displayed) Labs Reviewed - No data to display  EKG   Radiology No results found.  Procedures Procedures (including critical care  time)  Medications Ordered in UC Medications  methylPREDNISolone acetate (DEPO-MEDROL) injection 80 mg (80 mg Intramuscular Given 08/30/23 2028)    Initial Impression / Assessment and Plan / UC Course  I have reviewed the triage vital signs and the nursing notes.  Pertinent labs & imaging results that were available during my care of the patient were reviewed by me and considered in my medical decision making (see chart for details).     Plan of Care: Sinusitis: Discard the doxycycline.  Azithromycin, 250 mg, 2 pills today and then 1 pill daily for 4 days.  Depo-Medrol 80 mg injection here in the office.  Both the new antibiotic and the Depo-Medrol injection will help clear up her ear infection or sinus infection and the Depo-Medrol will also help with the allergic reaction. Left ear infection: The left ear infection should improve and resolve with use of the azithromycin and the Depo-Medrol shot.  See sinusitis for more information about the plan of care. Allergic reaction to doxycycline: Discard the doxycycline.  The Depo-Medrol injection will help the allergic reaction quite a bit.  Add cetirizine 10 mg 1 daily as needed for allergies and itching.  Follow-up if symptoms do not improve, worsen or new symptoms occur. Final Clinical Impressions(s) / UC Diagnoses   Final diagnoses:  Acute non-recurrent pansinusitis  Non-recurrent acute suppurative otitis media of left ear without spontaneous rupture of tympanic membrane  Allergic reaction due to anti-infective agent     Discharge Instructions      Sinusitis: Stop the doxycycline and discard it.  Azithromycin, 250 mg, 2 pills today then 1 pill daily for 4 days.  Encouraged sinus rinses.  Depo-Medrol, 80 mg, injection given in the office today.  This is a steroid injection and will help her allergy reaction to the previous antibiotic, it will help her sinus infection and it will help her ear infection.  Get plenty of fluids and  rest.  Ear infection on the left: Ear infection should resolve with use of azithromycin and prednisone.  See sinusitis for more information about these medications.  Allergic reaction to doxycycline: Stop the doxycycline.  Doxycycline is now listed as an allergy  in her chart.  She was given prednisone for ear infection and sinusitis and the prednisone will also help with the allergic reaction.  Add cetirizine, 10 mg once daily for itching and allergic reaction.  Follow-up if symptoms do not improve, worsen or new symptoms occur.     ED Prescriptions     Medication Sig Dispense Auth. Provider   azithromycin (ZITHROMAX) 250 MG tablet Take first 2 tablets today and then 1 tablet daily x 4 days. 6 tablet Marriah Sanderlin, FNP   cetirizine (ZYRTEC) 10 MG tablet Take 1 tablet (10 mg total) by mouth daily as needed for allergies or rhinitis (itching). 30 tablet Caterin Tabares, FNP      PDMP not reviewed this encounter.   Guss Legacy, FNP 08/30/23 2035

## 2023-08-30 NOTE — ED Triage Notes (Signed)
 Pt was seen yesterday and prescribed medications. This morning woke up with rash. Hasn't taken any medications for rash

## 2024-03-11 ENCOUNTER — Encounter (HOSPITAL_BASED_OUTPATIENT_CLINIC_OR_DEPARTMENT_OTHER): Payer: Self-pay

## 2024-03-11 ENCOUNTER — Ambulatory Visit (HOSPITAL_BASED_OUTPATIENT_CLINIC_OR_DEPARTMENT_OTHER)
Admission: EM | Admit: 2024-03-11 | Discharge: 2024-03-11 | Disposition: A | Discharge status: Home / Self Care | Attending: Family Medicine | Admitting: Family Medicine

## 2024-03-11 DIAGNOSIS — R051 Acute cough: Secondary | ICD-10-CM | POA: Diagnosis not present

## 2024-03-11 MED ORDER — AZITHROMYCIN 250 MG PO TABS: 250.0000 mg | ORAL_TABLET | Freq: Every day | ORAL | 0 refills | Status: AC

## 2024-03-11 MED ORDER — FLUTICASONE PROPIONATE 50 MCG/ACT NA SUSP: 1.0000 | Freq: Two times a day (BID) | NASAL | 0 refills | Status: AC | PRN

## 2024-03-11 MED ORDER — CETIRIZINE HCL 10 MG PO TABS: 10.0000 mg | ORAL_TABLET | Freq: Every day | ORAL | 0 refills | Status: AC | PRN

## 2024-03-11 NOTE — ED Triage Notes (Addendum)
 Pt c/o cough-productive, chest tightness, sore throat, HA, and chills for the last week. Pt has taken mucinex with some relief.

## 2024-03-11 NOTE — ED Provider Notes (Signed)
 [facility]    CSN: [id number] Arrival date & time: 03/11/24  1559      History   Chief Complaint Chief Complaint  Patient presents with   Cough    HPI Allison Tran is a 33 y.o. female.   Pt c/o cough-productive, chest tightness, sore throat, HA, and chills. Pt has taken mucinex with some relief.     Cough   Past Medical History:  Diagnosis Date   Chronic low back pain    Migraine     Patient Active Problem List   Diagnosis Date Noted   Chronic migraine 07/21/2016    History reviewed. No pertinent surgical history.  OB History     Gravida  1   Para      Term      Preterm      AB      Living         SAB      IAB      Ectopic      Multiple      Live Births               Home Medications    Prior to Admission medications   Medication Sig Start Date End Date Taking? Authorizing Provider  acetaminophen  (TYLENOL ) 500 MG tablet Take 500 mg by mouth every 6 (six) hours as needed.    [provider]  cetirizine  (ZYRTEC ) 10 MG tablet Take 1 tablet (10 mg total) by mouth daily as needed for allergies or rhinitis (itching). 08/30/23   Ival Domino, FNP  fluticasone  (FLONASE ) 50 MCG/ACT nasal spray Place 1 spray into both nostrils 2 (two) times daily as needed for rhinitis. 08/29/23 09/28/23  Ival Domino, FNP    Family History Family History  Problem Relation Age of Onset   Ulcers Mother    Ulcers Father     Social History Social History   Tobacco Use   Smoking status: Never   Smokeless tobacco: Never  Vaping Use   Vaping status: Never Used  Substance Use Topics   Alcohol use: No   Drug use: No     Allergies   Doxycycline    Review of Systems Review of Systems  Respiratory:  Positive for cough.      Physical Exam Triage Vital Signs ED Triage Vitals [03/11/24 1606]  Encounter Vitals Group     BP      Girls Systolic BP Percentile      Girls Diastolic BP Percentile      Boys Systolic BP  Percentile      Boys Diastolic BP Percentile      Pulse      Resp      Temp      Temp src      SpO2      Weight      Height      Head Circumference      Peak Flow      Pain Score 7     Pain Loc      Pain Education      Exclude from Growth Chart    No data found.  Updated Vital Signs There were no vitals taken for this visit.  Visual Acuity Right Eye Distance:   Left Eye Distance:   Bilateral Distance:    Right Eye Near:   Left Eye Near:    Bilateral Near:     Physical Exam   UC Treatments / Results  Labs (all labs  ordered are listed, but only abnormal results are displayed) Labs Reviewed - No data to display  EKG   Radiology No results found.  Procedures Procedures (including critical care time)  Medications Ordered in UC Medications - No data to display  Initial Impression / Assessment and Plan / UC Course  I have reviewed the triage vital signs and the nursing notes.  Pertinent labs & imaging results that were available during my care of the patient were reviewed by me and considered in my medical decision making (see chart for details).     *** Final Clinical Impressions(s) / UC Diagnoses   Final diagnoses:  None   Discharge Instructions   None    ED Prescriptions   None    PDMP not reviewed this encounter.

## 2024-03-11 NOTE — Discharge Instructions (Signed)
 I am treating you for an upper respiratory infection and allergies.  I have refilled the Zyrtec  and Flonase  for allergies and giving you azithromycin  for infection.  Take the medications as prescribed. You can continue the Mucinex day time and nightime for cough  Follow-up as needed
